# Patient Record
Sex: Male | Born: 1955
Health system: Southern US, Community
[De-identification: ages and names within clinical notes are randomized; demographics above are authoritative.]

## PROBLEM LIST (undated history)

## (undated) DIAGNOSIS — E785 Hyperlipidemia, unspecified: Secondary | ICD-10-CM

## (undated) DIAGNOSIS — E119 Type 2 diabetes mellitus without complications: Secondary | ICD-10-CM

---

## 1998-05-25 ENCOUNTER — Emergency Department (HOSPITAL_COMMUNITY): Admission: EM | Admit: 1998-05-25 | Discharge: 1998-05-25 | Payer: Self-pay | Admitting: Emergency Medicine

## 1998-07-09 ENCOUNTER — Encounter: Admission: RE | Admit: 1998-07-09 | Discharge: 1998-10-07 | Payer: Self-pay | Admitting: Neurosurgery

## 2007-07-31 ENCOUNTER — Ambulatory Visit: Payer: Self-pay | Admitting: Internal Medicine

## 2007-08-21 ENCOUNTER — Ambulatory Visit: Payer: Self-pay | Admitting: Internal Medicine

## 2007-08-21 ENCOUNTER — Inpatient Hospital Stay (HOSPITAL_COMMUNITY): Admission: AC | Admit: 2007-08-21 | Discharge: 2007-08-29 | Payer: Self-pay

## 2007-08-26 ENCOUNTER — Encounter (INDEPENDENT_AMBULATORY_CARE_PROVIDER_SITE_OTHER): Payer: Self-pay | Admitting: Internal Medicine

## 2007-08-30 ENCOUNTER — Ambulatory Visit: Payer: Self-pay | Admitting: *Deleted

## 2007-10-31 ENCOUNTER — Encounter (INDEPENDENT_AMBULATORY_CARE_PROVIDER_SITE_OTHER): Payer: Self-pay | Admitting: Internal Medicine

## 2007-10-31 ENCOUNTER — Ambulatory Visit: Payer: Self-pay | Admitting: Family Medicine

## 2007-10-31 LAB — CONVERTED CEMR LAB
ALT: <8 units/L (ref 0–53)
Albumin: 4.5 g/dL (ref 3.5–5.2)
Alkaline Phosphatase: 67 units/L (ref 39–117)
Basophils Absolute: 0.1 10*3/uL (ref 0.0–0.1)
Chloride: 107 meq/L (ref 96–112)
Creatinine, Ser: 1.14 mg/dL (ref 0.40–1.50)
HCT: 43.2 % (ref 39.0–52.0)
HCV Ab: NEGATIVE
Hep B Core Total Ab: NEGATIVE
Lymphocytes Relative: 34 % (ref 12–46)
Lymphs Abs: 1.6 10*3/uL (ref 0.7–4.0)
MCV: 87.8 fL (ref 78.0–100.0)
Monocytes Relative: 6 % (ref 3–12)
Neutrophils Relative %: 57 % (ref 43–77)
Potassium: 4.6 meq/L (ref 3.5–5.3)
RBC: 4.92 M/uL (ref 4.22–5.81)
RDW: 15.7 % — ABNORMAL HIGH (ref 11.5–15.5)
Sodium: 141 meq/L (ref 135–145)
Total Bilirubin: 0.5 mg/dL (ref 0.3–1.2)
WBC: 4.9 10*3/uL (ref 4.0–10.5)

## 2007-12-05 ENCOUNTER — Emergency Department (HOSPITAL_COMMUNITY): Admission: EM | Admit: 2007-12-05 | Discharge: 2007-12-05 | Payer: Self-pay | Admitting: Emergency Medicine

## 2008-03-05 ENCOUNTER — Encounter: Payer: Self-pay | Admitting: Family Medicine

## 2008-03-05 ENCOUNTER — Ambulatory Visit: Payer: Self-pay | Admitting: Internal Medicine

## 2008-03-05 LAB — CONVERTED CEMR LAB
Barbiturate Quant, Ur: NEGATIVE
Benzodiazepines.: NEGATIVE
Cholesterol: 152 mg/dL (ref 0–200)
Cocaine Metabolites: POSITIVE — AB
Marijuana Metabolite: NEGATIVE
Propoxyphene: NEGATIVE
Total CHOL/HDL Ratio: 2.4
Triglycerides: 75 mg/dL (ref <150)
VLDL: 15 mg/dL (ref 0–40)

## 2011-01-27 NOTE — Procedures (Signed)
EEG NUMBER:  [id number].   ORDERED BY:  Dr. Molli Knock and Dr. Pearlean Brownie.   This is a 55 year old person who was found unresponsive secondary to  cocaine, and was having seizures.  Off sedatives currently.  No seizures  witnessed clinically.   MEDICATIONS LISTED INCLUDE:  Protonix, heparin, aspirin, insulin,  NovoLog, flu vaccine, pneumonia vaccine, Cerebyx, albuterol, Versed,  fentanyl, Ativan, Lantus, Diprivan, and normal saline; but currently not  being sedated, apparently.   This is a portable 17 channel EEG with one channel devoted to EKG  utilizing [employer] 10/20 lead placement system.  The patient was  described as being unresponsive.  The patient had a prior study 2 days  before which showed almost a burst suppression pattern with a lot of  background slowing.  Currently, the study showed a very low amplitude  theta slowing fairly diffusely without any clear focal abnormality or  seizure activity.  This is fairly monotonous throughout with infrequent  higher voltage delta discharges noted, an artifact is seen as well.  Occasional poorly formed sharp-and-slow wave discharges are seen mostly  from the C3 regions, just to the left of midline, which are somewhat  epileptiform; but without clinical seizure activity noted.   Activation procedures were not performed.   The EKG monitor reveals a tachycardia with a rate of 102 beats per  minute.   CONCLUSION:  Abnormal EEG demonstrating severe generalized slowing with  occasional sharp-and-slow wave complexes seen around the C3 region.  This suggests the possible of a structural lesion in this region.  This  is somewhat different in appearance from the prior study, but equally  severe slowing.  Clinical correlation is recommended.      Catherine A. Orlin Hilding, M.D.  Electronically Signed     ZOX:WRUE  D:  08/24/2007 16:21:30  T:  08/25/2007 08:50:19  Job #:  [id number]

## 2011-01-27 NOTE — Discharge Summary (Signed)
NAMECURREN, MOHRMANN               ACCOUNT NO.:  [id number]   MEDICAL RECORD NO.:  [id number]          PATIENT TYPE:  INP   LOCATION:  5017                         FACILITY:  [facility]   PHYSICIAN:  Charlcie Cradle. Delford Field, MD, FCCPDATE OF BIRTH:  August 04, 1956   DATE OF ADMISSION:  08/21/2007  DATE OF DISCHARGE:  08/29/2007                               DISCHARGE SUMMARY   DISCHARGE DIAGNOSES:  1. Status post respiratory failure, secondary to altered mental      status.  2. Seizure disorder with history of cocaine and EtOH abuse.  3. Toxic metabolic encephalopathy.   PROCEDURES:  August 21, 2007 endotracheal tube.  Date August 22, 2007 right internal jugular vein catheter.  These all  since been removed at time of discharge.   LABORATORY DATA:  Date August 28, 2007:  Sodium 137, potassium 3.7,  chloride 101, CO2 27, glucose 132, BUN 17, creatinine 0.77, AST 364, ALT  73, albumin 2.3.  Date August 28, 2007:  White blood cell count 6.7, hemoglobin 10.7,  hematocrit 32.6, platelet count 278.   CONSULTATIONS:  Dr. Mcneil Sober.   RADIOLOGY:  MRI of brain, MRI/MRI of brain on August 22, 2007,  demonstrating no evidence of acute ischemia, right frontal  encephalomalacia with surrounding atrophy and gliosis, most likely  sequelae to previous trauma.  Focal MRA result demonstrates foci of  caliber irregularity, involving the anterior and posterior circulation.  Question as to whether represented a arteriosclerosis versus chemical  vasculitis, given history of cocaine.  There is old flow signal area in  the right MCA region inferiorly, which may represent prominent vessel  loop, with less likely possibility of aneurysm read by Dr. Corliss Skains.   BRIEF HISTORY:  This a 55 year old male patient who was found  unresponsive at a crack house.  He had a witnessed seizure by EMS en  route to the hospital, he had refractory seizures and status  epilepticus, requiring Diprivan drip and Ativan.  He  had a severe  metabolic acidosis on presentation.  He was admitted to the pulmonary  critical care service for further evaluation and treatment.  Neurology  consultation was obtained.   HOSPITAL COURSE BY DISCHARGE DIAGNOSES:  1. Acute respiratory failure secondary to altered mental status.  Mr.      Blenda Bridegroom was admitted to the intensive care setting.  He was placed      on full ventilatory support.  He was maintained on mechanical      ventilation, until his mental status improved.  He was successfully      extubated on August 26, 2007.  Since this time, his oxygen      support has been weaned.  He is now on room air without pulmonary      sequelae.  2. Altered mental status, seizure disorder.  As previously mentioned,      Mr. Theresa Mulligan was initially found unresponsive, then had status      epilepticus en route to the hospital.  He was treated aggressively,      initially placed on fosphenytoin, then Dilantin drip.  Because of  elevated LFTs, he was switched to Keppra by Dr. Pearlean Brownie.  He is now      all maintained on Keppra, his mental status a slight is slowly      improving to baseline.  He has had no further seizure activity.  He      will follow up with Dr. Pearlean Brownie in the outpatient setting.  It was      felt that seizure could been contributed to by cocaine-induced      vasculitis.  3. Toxic metabolic encephalopathy.  This is felt to be primarily      secondary to history of polysubstance abuse, as well as postictal      state, as well as severe metabolic acidosis at one point.  This has      all resolved.  Mr. Theresa Mulligan will be discharged under the watchful eye      of his family.   FOLLOWUP:  Dr. Mcneil Sober on January 29 at 12 noon.  He is also  pending [facility] followup, for which she will be assigned for primary  care.  Currently, our case manager is setting up this appointment.   DISCHARGE MEDICATIONS:  1. Keppra 500 mg tablets, 1 tablet b.i.d.  2. Thiamine 100 mg p.o.  daily.  3. Folic acid 1 mg p.o. daily.  4. Finally, aspirin 81 mg p.o. daily.   Upon time of discharge, he is stable.  Vital signs are stable.  Breath  sounds are clear to auscultation, neurologically intact and medically  ready for discharge.      Zenia Resides, NP      Charlcie Cradle. Delford Field, MD, [facility]  Electronically Signed    PB/MEDQ  D:  08/29/2007  T:  08/29/2007  Job:  [id number]

## 2011-01-27 NOTE — Procedures (Signed)
CLINICAL HISTORY:  A 55 year old male found unresponsive secondary to  cocaine use and possible seizures.   MEDICATIONS LISTED:  Ativan drip and Diprivan drip __________ at the  time of the EEG.   This is a portable EEG, with the patient described as unresponsive.  Performed using 17-channel machine and standard 10/20 electrode  placement.   The background is severely suppressed and consists of extremely low  amplitude (3-4 Hz delta activity) which lasted for 2-4 seconds and has  been interrupted with periods of 6-7 Hz __________, mixed with high  frequency low amplitude beta activity -- which is seen bilaterally in a  diffuse and symmetric distribution.  No clear paroxysmal epileptiform  activity spikes or sharp waves are seen.  No awake portions of the  record are noted.  Isolated intermittent sharp waves are noted rising  from left central and temporal head regions, but no clear epileptiform  features are noted.   LENGTH OF THE RECORDING:  22.4 minutes.   TECHNICAL COMPONENT:  average.   EKG TRACING:  Reveals regular sinus rhythm.   HYPERVENTILATION:  Is not performed.   PHOTIC STIMULATION:  Is unremarkable.   IMPRESSION:  This EEG is abnormal, due to presence of severe background  slowing and mild left-sided cortical irritability.  However, no definite  epileptiform features were noted.           ______________________________  Sunny Schlein. Pearlean Brownie, MD     DXI:PJAS  D:  08/22/2007 18:41:22  T:  08/23/2007 09:32:16  Job #:  [id number]

## 2011-01-27 NOTE — Consult Note (Signed)
[name], [name]               ACCOUNT NO.:  [id number]   MEDICAL RECORD NO.:  [id number]          PATIENT TYPE:  INP   LOCATION:  2115                         FACILITY:  [facility]   PHYSICIAN:  Pramod P. Pearlean Brownie, MD    DATE OF BIRTH:  November 26, 1955   DATE OF CONSULTATION:  DATE OF DISCHARGE:                                 CONSULTATION   REFERRING PHYSICIAN:  Felipa Evener, MD   REASON FOR REFERRAL:  seizures.   HISTORY OF PRESENT ILLNESS:  [name] is a 55 year old African  American male who was found unconscious and seizing in a crack house  early yesterday morning.  He was apparently having continuous seizures  requiring sedation and paralysis for intubation.  He has continued to  have seizures despite propofol drip and is at present on Ativan as well  as propofol drip.  He was found to be severely acidotic with a pH of 6.9  on admission.  He has been treated with fosphenytoin load as well as he  is on Ativan drip.  The patient is unable to provide any history, which  is obtained from his daughter who is present at the bedside.  The  patient apparently lives with a friend and does cocaine quite  frequently.  He was last seen normal the day before by the daughter.  His daughter does not remember any previous history of seizures or  significant neurological problems.  She does not remember any remote  head injury or stroke, and the CT scan on admission shows  encephalomalacia in the right frontal lobe.   PAST MEDICAL HISTORY:  Cocaine abuse.   SOCIAL HISTORY:  He lives with her friend.  He does cocaine frequently.   REVIEW OF SYSTEMS:  Not obtainable.   MEDICATIONS IN THE HOSPITAL:  Fosphenytoin, heparin, aspirin, Ventolin,  Ativan, propofol, fentanyl.   PHYSICAL EXAM:  An intubated middle-aged African American gentleman who  is unresponsive.  Temperature 98.2, pulse rate 90 per minute and regular, blood pressure  126/80, respiratory 20 per minute.  Distal pulses  well-felt.  HEAD:  Nontraumatic.  NECK:  Supple without bruits.  ENT:  Unremarkable.  CARDIAC:  Regular heart sounds.  No murmur or gallop.  LUNGS:  Clear to auscultation.  ABDOMEN:  Soft, nontender.  NEUROLOGIC:  The patient is comatose, unresponsive.  He is on Ativan  drip as well as propofol.  I examined him 5 minutes after the propofol  was switched off.  He was completely unresponsive.  Pupils are 2 mm,  sluggishly reactive.  Doll's eye movements are extremely sluggish.  He  does not wince to noxious stimuli.  He has no response to sternal rub.  He has minimum flexion response in upper extremities to nailbed  pressure.  There is minimum lower extremity flexor withdrawal.  Plantars  are not obtainable bilaterally.  Coordination and sensation were not  tested.   DATA REVIEWED:  Hospital chart was reviewed.  CT scan of the head  reveals no acute infarct and the old right frontal encephalomalacia,  which may represent remote-age infarct or hemorrhage.  EEG  and MRI are  both pending at this time.   IMPRESSION:  A 51-year gentleman with status epilepticus following  cocaine abuse.  His neurological exam is limited at this time secondary  to chemical, as well as postictal, state.   PLAN:  I would recommend aggressive treatment for seizures.  Will check  an EEG.  If he is having nonconvulsive seizures, we may need to be more  aggressive with the medications.  Otherwise, continue phenytoin for  seizure prophylaxis.  If he has more seizures, recommend adding IV  Depacon.  Check MRI to rule out any underlying stroke.  I had a long  discussion the patient's mother-in-law and daughter regarding his  prognosis and answered questions.  The patient's prognosis is quite  guarded at this point.  He may have __________ a hypoxic injury but it  is too early to comment on that, especially since his exam is limited  due to sedation.   I will be happy to follow the patient in consult.  Kindly call  for  questions.           ______________________________  Sunny Schlein. Pearlean Brownie, MD     PPS/MEDQ  D:  08/22/2007  T:  08/23/2007  Job:  [id number]

## 2011-01-27 NOTE — H&P (Signed)
NAMESHASHWAT, CLEARY NO.:  [id number]   MEDICAL RECORD NO.:  [id number]          PATIENT TYPE:  EMS   LOCATION:  MAJO                         FACILITY:  [facility]   PHYSICIAN:  Nelda Bucks, MD DATE OF BIRTH:  [birth year]   DATE OF ADMISSION:  08/21/2007  DATE OF DISCHARGE:                              HISTORY & PHYSICAL   Please note, this patient was identified as [id number] upon admission; now  we have identified him.   HISTORY OF PRESENT ILLNESS:  This is a 55 year old African American male  with a very limited history available at this point in time.  This  gentleman was found down at a crack house institution where illicit  drugs were taking place, and found unresponsive.  Also, at the crack  house, this patient was found to have active seizure activity with upper  and lower extremity shaking.  EMS was notified.  The patient was brought  to [facility], where the patient was also noted to  have seizures approximately 45 seconds to 1 minute duration with  multiple episodes.  The patient was taken to CT, and also had a seizure  there as well.  His GCF at that time was reported from the ER physician  to be 9.  The CAT scan did not reveal any intracranial hemorrhage.  He  was given Ativan aggressively for seizure treatment.  The patient  certainly required intubation for airway protection, as his pH was found  to be 6.9 with a very, very poor mental status.  The patient had  required etomidate and paralytic agents for intubation.  He also was  found to have a severe metabolic acidosis, and high minute ventilation  was required for his ventilator machine.  There is no other history  available at this time.   Past medical, past surgical, medications, allergies, social and family  history, and review of systems are completely unobtainable.   PHYSICAL EXAMINATION:  VITAL SIGNS:  Shows a temperature of 97.9, pulse  is 109, blood pressure is  134/75, respiratory rate is 20, pulse oximetry  is 100% on 30% FiO2 on the ventilator machine.  GENERAL:  Reveals the patient to be intubated, sedated and unresponsive  to painful stimuli.  HEENT:  Pupils are equally round and reactive to light, approximately 4  mm.  Please note that the right pupil is slightly smaller at 3 mm, and  they were all reactive but sluggish.  Corneal reflexes were intact.  Please note, this patient had recent paralytic agents given to him.  NECK:  Muscular and with no rigidity, with no significant jugular venous  distention.  CARDIOVASCULAR:  Showed an S1, S2.  Regular rate and rhythm.  No rubs,  no murmurs, no gallops, no heaves.  LUNGS:  Showed clear to auscultation bilaterally.  No rhonchi heard.  ABDOMINAL:  Showed a belly which was soft, nontender, nondistended.  No  rebound, no guarding.  Bowel sounds were hypoactive, but present.  EXTREMITIES:  Shows no edema and no rashes, and perfused warm  extremities.  NEUROLOGIC:  Very difficult to assess.  Pupils examination as stated  above.  He is unresponsive with recent sedation, and sedated to a RAS  scale of -4.  Again, no movement to painful stimuli.   LABORATORY DATA:  Showed a CK of 197.  His sodium was 141, potassium is  4.3, chloride is 104, bicarb is 7, with a BUN of 17, creatinine 2.09,  and a glucose of 227.  Calcium 9.7.  Anion gap is 30 calculated.  His  urinary tox screen shows cocaine and benzodiazepines.  Positive alcohol  of 5.  ABG initially showed pH of 6.9, pCO2 of 29, pO2 of 105, with a  calculated bicarb of 6.3.  Repeated on a ventilator is pH 7.21, pCO2 of  40, pO2 of 396, with a bicarb of 16 on current settings of tidal volume  is 600, rate of 30.  Myoglobulin is greater than 500 on point of care at  the bedside.  Troponin is less than 0.05.   A chest x-ray shows an endotracheal tube at approximately 6 cm above the  carina.  This will be advanced.  It also shows hyperinflated lung  films  consistent with a probable COPD.  Head CT, awaiting official report from  the radiologist, shows an old right frontal infarct with no intracranial  hemorrhage noted, and the ventricular system appears to be normal.   ASSESSMENT AND PLAN:  1. Seizure secondary to cocaine problem.  2. Rule out cocaine-induced stroke.  3. Metabolic acidosis uncompensated, rule out secondary to seizures      and lactic acidosis.  4. Hypoxia.  5. To rule out rhabdomyolysis secondary to cocaine overdose.  6. A large anion gap acidosis secondary to probable seizures, to rule      out additional ischemia, to rule out shock liver, or any other      abdominal source of ischemia.  7. Acute renal failure secondary to acute tubular necrosis versus      chronic renal insufficiency given the BUN and creatinine ratio,      which appears to be renal nature.  If the patient is a chronic      cocaine abuser, this could be secondary to __________  cocaine      ischemia to his kidneys.  8. Hyperglycemia.  9. To rule out anoxic brain injury, found down.   PLAN PER ASSISTANCE APPROACH:  1. Neuro:  The patient's head of bed already elevated.  CAT scan at      this point does not reveal any acute process and no intracranial      hemorrhage.  This does not mean he did not suffer an acute cocaine-      induced vasospasm ischemic stroke.  This patient will require an      MRI of his brain.  At this point in time, given the renal      insufficiency, we will not use gadolinium given the new FDA      warnings.  The patient has not been given dilantin, which is      appropriate given the fact that he his seizures are secondary to      cocaine.  We will be using benzodiazepines in the form of Versed 1-      2 mg q.1 hour p.r.n. or Ativan for any seizures noted.  The patient      will be given propofol for sedation.  Propofol is a good drug for      this patient because it also will reduce his  seizures at the       threshold, and since he is hemodynamically stable, his blood      pressure should be able to tolerate this.  The patient will be      given fentanyl for any pain noted.  We will also reassess an EEG to      rule out an active seizure focus.  2. Respiratory:  The patient required intubation given his poor GSC      and a severe uncompensated metabolic acidosis.  He is improving on      the ventilator with current settings tidal volume 600, rate of 30.      I would leave him on this high minute ventilation and repeat his      ABG at 11 a.m.  He also has an improved A-a gradient with a PA2      currently approaching 400, so his FiO2 has been reduced to 30%.      His endotracheal tube will also be advanced 2 cm onward and his      portable chest x-ray will be assessed tomorrow.  We will also give      this patient probable albuterol q.6 hourly MDIs given his probable      COPD noted on his portable chest x-ray.  3. Cardiovascular:  We are going to rule out cocaine-induced ischemia.      The patient is hemodynamically stable at this time.  We will also      assess an EKG, which has not been done at this time, as well as      serial troponin levels.  To note, if this patient does become      hypertensive, we will not be using isolated beta blockade for the      risk of an unopposed alpha on this patient.  If required, labetalol      could be a selection for him since it comes in alpha and beta      agonistic activity.  4. Renal:  The patient probably has a chronicity to some of his renal      failure, although hydration will be provided with this patient, and      he may require a renal ultrasound to look for medical renal      disease.  At this point, he is making very, very good urinal.  We      will follow his trend with a once hourly chemistry panel in the      p.m.  We will also be assessing a urine osmolality urine sodium.      To note, this patient also will require salicylate levels and  a      Tylenol level, and we will also assess his osmolality for an      osmolar gap.  His presentation thus far with improvement      significantly of his metabolic acidosis most likely is secondary to      lactic acidosis from seizure activity, which typically resolves      very quickly.  5. GI:  The patient will have LFTs checked to rule out shock liver and      cocaine-induced ischemia, which are pending.  We will also monitor      his stools very closely for loose stools or anything related to      mucosal sluffing related to potentially ischemia.  There is no      evidence of this at this time.  The patient did have persistent      metabolic acidosis.  Abdominal CT scan and/or flex sig would be      indicated at the time.  The patient will be given a proton pump      inhibitor IV, 40 mg of Protonix daily only.  Also, we will check an      amylase and lipase.  6. Infectious disease:  There is no evidence of aspiration on his      chest x-ray.  He is afebrile at this time.  If this patient spikes      greater than a 101.5, the patient will have blood cultures spent,      as well as a sputum.  We will start the patient on Unasyn since he      potentially comes from home.  7. Hematology:  The patient will have coagulation profile sent.  They      are pending at this time.  I have written for subcutaneous heparin      5000 t.i.d. for DVT prophylaxis given his high risk of being on the      ventilator machine.  If coagulation profile is abnormal, then the      subcu heparin will be discontinued.  At this time, there is no      active bleeding process noted on physical examination.  8. Endocrine:  The patient will have sliding scale insulin started for      his hyperglycemia.  We will try our best to obtain further history      from his daughter, who I think came to the emergency room and has      gone home.  I have not had the opportunity to discuss his status      with her.   This  patient is critically ill, requiring 45 minutes of my constant  attention and care with Dr. Norton Blizzard.      Nelda Bucks, MD  Electronically Signed     DJF/MEDQ  D:  08/21/2007  T:  08/21/2007  Job:  [id number]

## 2011-06-22 LAB — DIFFERENTIAL
Basophils Absolute: 0
Basophils Absolute: 0.1
Basophils Relative: 1
Eosinophils Absolute: 0.3
Eosinophils Relative: 0
Eosinophils Relative: 3
Lymphocytes Relative: 15
Lymphs Abs: 1.1
Monocytes Absolute: 0.1
Monocytes Relative: 2 — ABNORMAL LOW
Neutro Abs: 6
Neutrophils Relative %: 78 — ABNORMAL HIGH

## 2011-06-22 LAB — BASIC METABOLIC PANEL
BUN: 18
BUN: 18
CO2: 27
Calcium: 8.8
Chloride: 102
Chloride: 108
Creatinine, Ser: 0.84
Creatinine, Ser: 0.86
GFR calc Af Amer: >60
GFR calc Af Amer: >60
GFR calc non Af Amer: >60
GFR calc non Af Amer: >60
Glucose, Bld: 84
Potassium: 3.3 — ABNORMAL LOW
Potassium: 3.5
Sodium: 143

## 2011-06-22 LAB — CARDIAC PANEL(CRET KIN+CKTOT+MB+TROPI)
CK, MB: 1.7
Relative Index: 0.1

## 2011-06-22 LAB — POCT I-STAT 3, ART BLOOD GAS (G3+)
Acid-Base Excess: 2
Acid-Base Excess: 4 — ABNORMAL HIGH
Acid-base deficit: 26 — ABNORMAL HIGH
Acid-base deficit: 3 — ABNORMAL HIGH
Acid-base deficit: 3 — ABNORMAL HIGH
Bicarbonate: 16.4 — ABNORMAL LOW
Bicarbonate: 20.2
Bicarbonate: 28.3 — ABNORMAL HIGH
O2 Saturation: 100
O2 Saturation: 93
O2 Saturation: 96
Operator id: 129411
Operator id: 234041
Operator id: 236041
Operator id: 260421
Operator id: 299371
Patient temperature: 37
Patient temperature: 38.5
Patient temperature: 97.1
Patient temperature: 98.2
TCO2: 18
TCO2: 20
TCO2: 30
pCO2 arterial: 20.6 — ABNORMAL LOW
pCO2 arterial: 23.2 — ABNORMAL LOW
pCO2 arterial: 30.2 — ABNORMAL LOW
pCO2 arterial: 33.9 — ABNORMAL LOW
pCO2 arterial: 40.3
pH, Arterial: 7.426
pH, Arterial: 7.447
pH, Arterial: 7.453 — ABNORMAL HIGH
pH, Arterial: 7.574 — ABNORMAL HIGH
pO2, Arterial: 122 — ABNORMAL HIGH
pO2, Arterial: 396 — ABNORMAL HIGH
pO2, Arterial: 77 — ABNORMAL LOW

## 2011-06-22 LAB — LACTATE DEHYDROGENASE: LDH: 221

## 2011-06-22 LAB — CBC
HCT: 32.6 — ABNORMAL LOW
HCT: 32.8 — ABNORMAL LOW
HCT: 33.2 — ABNORMAL LOW
HCT: 34.6 — ABNORMAL LOW
HCT: 35.2 — ABNORMAL LOW
Hemoglobin: 10.7 — ABNORMAL LOW
Hemoglobin: 10.8 — ABNORMAL LOW
Hemoglobin: 11 — ABNORMAL LOW
Hemoglobin: 11.2 — ABNORMAL LOW
MCHC: 32.5
MCHC: 32.9
MCHC: 33
MCHC: 33.2
MCHC: 33.2
MCV: 84
MCV: 85
MCV: 85.2
MCV: 85.3
Platelets: 195
Platelets: 198
Platelets: 204
Platelets: 236
Platelets: 261
Platelets: 278
RBC: 3.82 — ABNORMAL LOW
RBC: 3.86 — ABNORMAL LOW
RBC: 3.97 — ABNORMAL LOW
RBC: 4.06 — ABNORMAL LOW
RBC: 4.18 — ABNORMAL LOW
RDW: 14.4
RDW: 14.8
RDW: 14.8
RDW: 14.8
RDW: 14.9
WBC: 11.1 — ABNORMAL HIGH
WBC: 15.4 — ABNORMAL HIGH
WBC: 6.7
WBC: 7.6
WBC: 8

## 2011-06-22 LAB — COMPREHENSIVE METABOLIC PANEL
ALT: 10
ALT: 11
ALT: 16
ALT: 29
ALT: 46
ALT: 73 — ABNORMAL HIGH
ALT: 81 — ABNORMAL HIGH
ALT: 9
AST: 188 — ABNORMAL HIGH
AST: 29
AST: 364 — ABNORMAL HIGH
AST: 671 — ABNORMAL HIGH
Albumin: 2.3 — ABNORMAL LOW
Albumin: 2.5 — ABNORMAL LOW
Albumin: 2.6 — ABNORMAL LOW
Albumin: 2.7 — ABNORMAL LOW
Albumin: 3 — ABNORMAL LOW
Albumin: 3.4 — ABNORMAL LOW
Alkaline Phosphatase: 103
Alkaline Phosphatase: 52
Alkaline Phosphatase: 53
Alkaline Phosphatase: 58
Alkaline Phosphatase: 62
Alkaline Phosphatase: 63
Alkaline Phosphatase: 66
BUN: 15
BUN: 17
BUN: 17
BUN: 18
BUN: 20
BUN: 20
BUN: 22
CO2: 19
CO2: 21
CO2: 22
CO2: 24
CO2: 27
CO2: 27
CO2: 7 — CL
Calcium: 8.2 — ABNORMAL LOW
Calcium: 8.2 — ABNORMAL LOW
Calcium: 8.3 — ABNORMAL LOW
Calcium: 8.4
Calcium: 8.8
Calcium: 8.9
Chloride: 101
Chloride: 104
Chloride: 109
Chloride: 109
Chloride: 109
Chloride: 111
Creatinine, Ser: 0.77
Creatinine, Ser: 0.95
Creatinine, Ser: 1.1
Creatinine, Ser: 1.3
Creatinine, Ser: 1.39
GFR calc Af Amer: >60
GFR calc Af Amer: >60
GFR calc Af Amer: >60
GFR calc Af Amer: >60
GFR calc non Af Amer: 50 — ABNORMAL LOW
GFR calc non Af Amer: 54 — ABNORMAL LOW
GFR calc non Af Amer: 58 — ABNORMAL LOW
GFR calc non Af Amer: >60
GFR calc non Af Amer: >60
GFR calc non Af Amer: >60
Glucose, Bld: 111 — ABNORMAL HIGH
Glucose, Bld: 125 — ABNORMAL HIGH
Glucose, Bld: 132 — ABNORMAL HIGH
Glucose, Bld: 145 — ABNORMAL HIGH
Glucose, Bld: 227 — ABNORMAL HIGH
Glucose, Bld: 68 — ABNORMAL LOW
Potassium: 3.6
Potassium: 3.6
Potassium: 3.6
Potassium: 3.7
Potassium: 3.7
Potassium: 4.1
Potassium: 4.3
Sodium: 134 — ABNORMAL LOW
Sodium: 137
Sodium: 137
Sodium: 139
Sodium: 140
Sodium: 140
Sodium: 144
Total Bilirubin: 0.4
Total Bilirubin: 0.5
Total Bilirubin: 0.9
Total Bilirubin: 0.9
Total Bilirubin: 1
Total Bilirubin: 1.2
Total Protein: 5.6 — ABNORMAL LOW
Total Protein: 6.1
Total Protein: 6.1
Total Protein: 6.3
Total Protein: 6.4
Total Protein: 6.5

## 2011-06-22 LAB — CULTURE, BLOOD (ROUTINE X 2)

## 2011-06-22 LAB — PHENYTOIN LEVEL, TOTAL: Phenytoin Lvl: 23.2 — ABNORMAL HIGH

## 2011-06-22 LAB — POCT CARDIAC MARKERS: Myoglobin, poc: >500

## 2011-06-22 LAB — MAGNESIUM
Magnesium: 2.2
Magnesium: 2.4

## 2011-06-22 LAB — URINE CULTURE

## 2011-06-22 LAB — TROPONIN I: Troponin I: 0.03

## 2011-06-22 LAB — LACTIC ACID, PLASMA: Lactic Acid, Venous: 2.6 — ABNORMAL HIGH

## 2011-06-22 LAB — RAPID URINE DRUG SCREEN, HOSP PERFORMED
Amphetamines: NOT DETECTED
Barbiturates: NOT DETECTED
Benzodiazepines: POSITIVE — AB
Cocaine: POSITIVE — AB
Opiates: NOT DETECTED
Tetrahydrocannabinol: NOT DETECTED

## 2011-06-22 LAB — OSMOLALITY, URINE: Osmolality, Ur: 380 — ABNORMAL LOW

## 2011-06-22 LAB — SODIUM, URINE, RANDOM: Sodium, Ur: 99

## 2011-06-22 LAB — PHOSPHORUS
Phosphorus: 1.8 — ABNORMAL LOW
Phosphorus: 2.7

## 2011-06-22 LAB — CK TOTAL AND CKMB (NOT AT ARMC)
CK, MB: 2.8
Relative Index: 0.4
Total CK: 674 — ABNORMAL HIGH

## 2011-06-22 LAB — APTT: aPTT: 23 — ABNORMAL LOW

## 2011-06-22 LAB — TRIGLYCERIDES: Triglycerides: 67

## 2011-06-22 LAB — ETHANOL: Alcohol, Ethyl (B): 5

## 2011-06-22 LAB — LIPASE, BLOOD: Lipase: 17

## 2018-06-01 ENCOUNTER — Ambulatory Visit (INDEPENDENT_AMBULATORY_CARE_PROVIDER_SITE_OTHER): Payer: Self-pay | Admitting: Family Medicine

## 2018-06-01 ENCOUNTER — Other Ambulatory Visit: Payer: Self-pay

## 2018-06-01 VITALS — BP 140/80 | HR 65 | Temp 97.5°F | Wt 200.4 lb

## 2018-06-01 DIAGNOSIS — O24119 Pre-existing diabetes mellitus, type 2, in pregnancy, unspecified trimester: Secondary | ICD-10-CM

## 2018-06-01 DIAGNOSIS — K5900 Constipation, unspecified: Secondary | ICD-10-CM

## 2018-06-01 DIAGNOSIS — E785 Hyperlipidemia, unspecified: Secondary | ICD-10-CM

## 2018-06-01 DIAGNOSIS — H9193 Unspecified hearing loss, bilateral: Secondary | ICD-10-CM

## 2018-06-01 DIAGNOSIS — E119 Type 2 diabetes mellitus without complications: Secondary | ICD-10-CM

## 2018-06-01 MED ORDER — LACTULOSE 10 GM/15ML PO SOLN: 10.0000 g | Freq: Every day | ORAL | 2 refills | Status: DC | PRN

## 2018-06-01 MED ORDER — METFORMIN HCL 500 MG PO TABS: 500.0000 mg | ORAL_TABLET | Freq: Two times a day (BID) | ORAL | 3 refills | Status: DC

## 2018-06-01 MED ORDER — ATORVASTATIN CALCIUM 20 MG PO TABS: 20.0000 mg | ORAL_TABLET | Freq: Every day | ORAL | 6 refills | Status: DC

## 2018-06-01 MED ORDER — DOCUSATE SODIUM 100 MG PO CAPS: 100.0000 mg | ORAL_CAPSULE | Freq: Every day | ORAL | 0 refills | Status: DC

## 2018-06-01 MED ORDER — INSULIN ISOPHANE HUMAN 100 UNIT/ML KWIKPEN: 8.0000 [IU] | PEN_INJECTOR | Freq: Every day | SUBCUTANEOUS | 11 refills | Status: DC

## 2018-06-01 MED ORDER — NICOTINE 14 MG/24HR TD PT24: 14.0000 mg | MEDICATED_PATCH | Freq: Every day | TRANSDERMAL | 0 refills | Status: DC

## 2018-06-01 MED ORDER — ATORVASTATIN CALCIUM 40 MG PO TABS: 40.0000 mg | ORAL_TABLET | Freq: Every day | ORAL | 3 refills | Status: DC
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
## 2018-06-01 NOTE — Patient Instructions (Signed)
It was great to meet you today! Thank you for letting me participate in your care!  Today, we discussed your overall health care. I have refilled your medications for your chronic medical conditions. Please don't hesitate to call me if you are having health issues or concerns. I will see you for follow up in 3 months.  Be well, Harolyn Rutherford, DO PGY-2, [facility]

## 2018-06-02 LAB — CBC WITH DIFFERENTIAL/PLATELET
Basophils Absolute: 0 x10E3/uL (ref 0.0–0.2)
Basos: 1 %
EOS (ABSOLUTE): 0.2 x10E3/uL (ref 0.0–0.4)
Eos: 3 %
Hematocrit: 44.5 % (ref 37.5–51.0)
Hemoglobin: 14.7 g/dL (ref 13.0–17.7)
Immature Grans (Abs): 0 x10E3/uL (ref 0.0–0.1)
Immature Granulocytes: 0 %
Lymphocytes Absolute: 2.9 x10E3/uL (ref 0.7–3.1)
Lymphs: 47 %
MCH: 27.7 pg (ref 26.6–33.0)
MCHC: 33 g/dL (ref 31.5–35.7)
MCV: 84 fL (ref 79–97)
Monocytes Absolute: 0.4 x10E3/uL (ref 0.1–0.9)
Monocytes: 7 %
Neutrophils Absolute: 2.6 x10E3/uL (ref 1.4–7.0)
Neutrophils: 42 %
Platelets: 219 x10E3/uL (ref 150–450)
RBC: 5.31 x10E6/uL (ref 4.14–5.80)
RDW: 15.1 % (ref 12.3–15.4)
WBC: 6.2 x10E3/uL (ref 3.4–10.8)

## 2018-06-02 LAB — LIPID PANEL
Chol/HDL Ratio: 2.4 ratio (ref 0.0–5.0)
Cholesterol, Total: 148 mg/dL (ref 100–199)
HDL: 62 mg/dL
LDL Calculated: 68 mg/dL (ref 0–99)
Triglycerides: 89 mg/dL (ref 0–149)
VLDL Cholesterol Cal: 18 mg/dL (ref 5–40)

## 2018-06-02 LAB — BASIC METABOLIC PANEL
BUN / CREAT RATIO: 22 (ref 10–24)
BUN: 23 mg/dL (ref 8–27)
CO2: 25 mmol/L (ref 20–29)
CREATININE: 1.06 mg/dL (ref 0.76–1.27)
Calcium: 10.6 mg/dL — ABNORMAL HIGH (ref 8.6–10.2)
Chloride: 102 mmol/L (ref 96–106)
GFR, EST AFRICAN AMERICAN: 86 mL/min/{1.73_m2} (ref >59)
GFR, EST NON AFRICAN AMERICAN: 74 mL/min/{1.73_m2} (ref >59)
Glucose: 103 mg/dL — ABNORMAL HIGH (ref 65–99)
Potassium: 4.4 mmol/L (ref 3.5–5.2)
Sodium: 140 mmol/L (ref 134–144)

## 2018-06-03 ENCOUNTER — Encounter: Payer: Self-pay | Admitting: Family Medicine

## 2018-06-03 NOTE — Progress Notes (Signed)
Called patient and attempted to leave message about her lab results. Her voice mail box was full was unable to leave a voice mail.   I am sending her a letter informing her of her essentially normal test results.

## 2018-06-07 DIAGNOSIS — E119 Type 2 diabetes mellitus without complications: Secondary | ICD-10-CM | POA: Insufficient documentation

## 2018-06-07 DIAGNOSIS — E785 Hyperlipidemia, unspecified: Secondary | ICD-10-CM | POA: Insufficient documentation

## 2018-06-07 DIAGNOSIS — K5900 Constipation, unspecified: Secondary | ICD-10-CM | POA: Insufficient documentation

## 2018-06-07 NOTE — Assessment & Plan Note (Signed)
A1c at next visit. Patient also encouraged to check his blood glucose at home before taking insulin and in the morning before breakfast. Patient has a brother with whom he lives that will help him monitor his blood glucose.   Continue Humalin 8U QHS and Metformin 500mg  BID until A1c

## 2018-06-07 NOTE — Assessment & Plan Note (Signed)
Continue Atorvastatin 20mg , lipid panel today

## 2018-06-07 NOTE — Assessment & Plan Note (Signed)
Continue docusate once a day and lactulose 3ml prn for constipation

## 2018-06-24 ENCOUNTER — Telehealth: Payer: Self-pay | Admitting: Family Medicine

## 2018-06-24 NOTE — Telephone Encounter (Signed)
Patient needs test strips for insulin to [employer].

## 2018-06-24 NOTE — Telephone Encounter (Signed)
Patient wants test strips which I do not see listed on meds list.  .Ozella Almond, CMA

## 2018-06-28 ENCOUNTER — Other Ambulatory Visit: Payer: Self-pay | Admitting: Family Medicine

## 2018-06-28 MED ORDER — GLUCOSE BLOOD VI STRP: ORAL_STRIP | 12 refills | Status: DC

## 2018-06-28 MED ORDER — ATORVASTATIN CALCIUM 20 MG PO TABS: 20.0000 mg | ORAL_TABLET | Freq: Every day | ORAL | 6 refills | Status: DC

## 2018-06-28 NOTE — Telephone Encounter (Signed)
Patient needs Atorvastatin refilled, and still needs test strips.

## 2018-06-28 NOTE — Progress Notes (Unsigned)
Sending in prescription for test strips

## 2018-06-29 NOTE — Telephone Encounter (Signed)
lmovm for pt to return call. Ordean Fouts Dawn, CMA  

## 2018-08-31 ENCOUNTER — Ambulatory Visit: Payer: Medicaid Other | Admitting: Family Medicine

## 2018-08-31 VITALS — BP 121/60 | HR 61 | Temp 97.6°F | Wt 212.2 lb

## 2018-08-31 DIAGNOSIS — E119 Type 2 diabetes mellitus without complications: Secondary | ICD-10-CM | POA: Diagnosis not present

## 2018-08-31 DIAGNOSIS — R05 Cough: Secondary | ICD-10-CM | POA: Diagnosis not present

## 2018-08-31 DIAGNOSIS — K5900 Constipation, unspecified: Secondary | ICD-10-CM

## 2018-08-31 DIAGNOSIS — R059 Cough, unspecified: Secondary | ICD-10-CM

## 2018-08-31 DIAGNOSIS — M1990 Unspecified osteoarthritis, unspecified site: Secondary | ICD-10-CM

## 2018-08-31 HISTORY — DX: Cough, unspecified: R05.9

## 2018-08-31 HISTORY — DX: Unspecified osteoarthritis, unspecified site: M19.90

## 2018-08-31 LAB — POCT GLYCOSYLATED HEMOGLOBIN (HGB A1C): HbA1c, POC (controlled diabetic range): 6.3 % (ref 0.0–7.0)

## 2018-08-31 MED ORDER — MELOXICAM 15 MG PO TABS: 15.0000 mg | ORAL_TABLET | Freq: Every day | ORAL | 0 refills | Status: DC

## 2018-08-31 MED ORDER — GUAIFENESIN-DM 100-10 MG/5ML PO SYRP: 5.0000 mL | ORAL_SOLUTION | ORAL | 0 refills | Status: DC | PRN

## 2018-08-31 MED ORDER — METFORMIN HCL 500 MG PO TABS: 500.0000 mg | ORAL_TABLET | Freq: Two times a day (BID) | ORAL | 0 refills | Status: DC

## 2018-08-31 MED ORDER — ATORVASTATIN CALCIUM 20 MG PO TABS: 20.0000 mg | ORAL_TABLET | Freq: Every day | ORAL | 0 refills | Status: DC

## 2018-08-31 NOTE — Progress Notes (Signed)
   HPI 62 year old who presents for diabetes follow-up and medication refill.  Previously seen by Dr. Garlan Fillers back in September 2019.  No A1c from that visit.  Has been taking Humulin 8 units daily in the morning, metformin 1 g at night.  Patient states he has been eating better and has been walking quite a bit.  Patient was prescribed lactulose while he was in prison for constipation.  He states this is been helping quite a bit and does not want to switch to anything else.  Patient with right elbow pain which she says has known arthritis.  He feels is consistent with arthritis flare.  He also is been having a persistent cough.  Started about a week ago and he is a nursing home symmetries cough.  No productive sputum no blood.  He states he recently had a small cold.  All symptoms have resolved aside from a cough.  CC: Diabetes management   ROS:   Review of Systems See HPI for ROS.   CC, SH/smoking status, and VS noted  Objective: BP 121/60   Pulse 61   Temp 97.6 F (36.4 C) (Oral)   Wt 212 lb 3.2 oz (96.3 kg)   SpO2 100%  Gen: Very pleasant 62 year old African-American male, resting comfortably on exam bed CV: RRR, no murmur Resp: CTAB, no wheezes, non-labored Abd: SNTND, BS present, no guarding or organomegaly Neuro: Alert and oriented, Speech clear, No gross deficits   Assessment and plan:  Type 2 diabetes mellitus without complication (HCC) O2H 6.3.  He will and no longer necessary.  We will stop this medication and have him just take metformin 1 g at night.  Follow-up in 3 months for A1c check.  Encouraged to continue his diet and exercise.  Arthritis Will give trial of meloxicam 15 mg daily for 3 weeks.  If does not work, encourage patient to follow-up with PCP for further management.  Cough Gave prescription for over-the-counter Robitussin-DM to hopefully alleviate some cost.  Likely post viral cough as patient stated he recently had a small  cold.  Constipation Patient taking lactulose 15 mL as needed for constipation.  Could consider switching to MiraLAX but patient does not feel like switching to this area.  Follow-up as needed   Orders Placed This Encounter  Procedures  . HgB A1c    Meds ordered this encounter  Medications  . meloxicam (MOBIC) 15 MG tablet    Sig: Take 1 tablet (15 mg total) by mouth daily.    Dispense:  21 tablet    Refill:  0  . guaiFENesin-dextromethorphan (ROBITUSSIN DM) 100-10 MG/5ML syrup    Sig: Take 5 mLs by mouth every 4 (four) hours as needed for cough.    Dispense:  118 mL    Refill:  0  . atorvastatin (LIPITOR) 20 MG tablet    Sig: Take 1 tablet (20 mg total) by mouth daily.    Dispense:  180 tablet    Refill:  0  . metFORMIN (GLUCOPHAGE) 500 MG tablet    Sig: Take 1 tablet (500 mg total) by mouth 2 (two) times daily with a meal.    Dispense:  180 tablet    Refill:  0   Guadalupe Dawn MD PGY-2 Family Medicine Resident  08/31/2018 9:58 AM

## 2018-08-31 NOTE — Assessment & Plan Note (Signed)
Patient taking lactulose 15 mL as needed for constipation.  Could consider switching to MiraLAX but patient does not feel like switching to this area.  Follow-up as needed

## 2018-08-31 NOTE — Patient Instructions (Addendum)
It was great meeting you today! Your A1C was 6.3. This means that you no longer need to take any insulin. Continue taking the metformin 1g at night. Please follow up in 3 months for another check, and your regimen can be titrated at that visit.  I refilled your metformin and atorvastatin. I also gave you an anti-inflammatory to help with your arthritis and your abdominal wall pain.  I gave you a script for robitussin DM to hopefully make it a little cheaper.

## 2018-08-31 NOTE — Assessment & Plan Note (Addendum)
Gave prescription for over-the-counter Robitussin-DM to hopefully alleviate some cost.  Likely post viral cough as patient stated he recently had a small cold.

## 2018-08-31 NOTE — Assessment & Plan Note (Signed)
Will give trial of meloxicam 15 mg daily for 3 weeks.  If does not work, encourage patient to follow-up with PCP for further management.

## 2018-08-31 NOTE — Assessment & Plan Note (Signed)
A1c 6.3.  He will and no longer necessary.  We will stop this medication and have him just take metformin 1 g at night.  Follow-up in 3 months for A1c check.  Encouraged to continue his diet and exercise.

## 2018-09-17 ENCOUNTER — Encounter (HOSPITAL_COMMUNITY): Payer: Self-pay

## 2018-09-17 ENCOUNTER — Emergency Department (HOSPITAL_COMMUNITY): Payer: Medicaid Other

## 2018-09-17 ENCOUNTER — Other Ambulatory Visit: Payer: Self-pay

## 2018-09-17 ENCOUNTER — Emergency Department (HOSPITAL_COMMUNITY)
Admission: EM | Admit: 2018-09-17 | Discharge: 2018-09-17 | Disposition: A | Discharge status: Home / Self Care | Payer: Medicaid Other | Attending: Emergency Medicine | Admitting: Emergency Medicine

## 2018-09-17 DIAGNOSIS — F17210 Nicotine dependence, cigarettes, uncomplicated: Secondary | ICD-10-CM | POA: Diagnosis not present

## 2018-09-17 DIAGNOSIS — S161XXA Strain of muscle, fascia and tendon at neck level, initial encounter: Secondary | ICD-10-CM | POA: Insufficient documentation

## 2018-09-17 DIAGNOSIS — E119 Type 2 diabetes mellitus without complications: Secondary | ICD-10-CM | POA: Insufficient documentation

## 2018-09-17 DIAGNOSIS — S39012A Strain of muscle, fascia and tendon of lower back, initial encounter: Secondary | ICD-10-CM | POA: Diagnosis not present

## 2018-09-17 DIAGNOSIS — Y9389 Activity, other specified: Secondary | ICD-10-CM | POA: Insufficient documentation

## 2018-09-17 DIAGNOSIS — Y929 Unspecified place or not applicable: Secondary | ICD-10-CM | POA: Insufficient documentation

## 2018-09-17 DIAGNOSIS — Z79899 Other long term (current) drug therapy: Secondary | ICD-10-CM | POA: Diagnosis not present

## 2018-09-17 DIAGNOSIS — Y999 Unspecified external cause status: Secondary | ICD-10-CM | POA: Insufficient documentation

## 2018-09-17 MED ORDER — IBUPROFEN 800 MG PO TABS
800.0000 mg | ORAL_TABLET | Freq: Once | ORAL | Status: AC
  Administered 2018-09-17: 800 mg via ORAL
  Filled 2018-09-17: qty 1

## 2018-09-17 MED ORDER — IBUPROFEN 600 MG PO TABS: 600.0000 mg | ORAL_TABLET | Freq: Three times a day (TID) | ORAL | 0 refills | Status: DC | PRN

## 2018-09-17 MED ORDER — TIZANIDINE HCL 4 MG PO TABS: 4.0000 mg | ORAL_TABLET | Freq: Three times a day (TID) | ORAL | 0 refills | Status: DC | PRN
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
## 2018-10-31 ENCOUNTER — Other Ambulatory Visit: Payer: Self-pay | Admitting: Family Medicine

## 2018-10-31 NOTE — Telephone Encounter (Signed)
Called patient to find out which medications he needed refilled based on the message he left this morning.Patient stated that he already has  his medications from the pharmacy.  [name], [employer]

## 2018-10-31 NOTE — Telephone Encounter (Signed)
Patient is out of all of his medications, says it is just two.  It is supposed to go to [facility] on 29.  Please call with any questions [phone]

## 2018-11-15 ENCOUNTER — Other Ambulatory Visit: Payer: Self-pay

## 2018-11-15 ENCOUNTER — Encounter: Payer: Self-pay | Admitting: Family Medicine

## 2018-11-15 ENCOUNTER — Ambulatory Visit: Payer: Medicaid Other | Admitting: Family Medicine

## 2018-11-15 VITALS — BP 124/80 | HR 60 | Temp 98.0°F | Ht 75.0 in | Wt 193.6 lb

## 2018-11-15 DIAGNOSIS — E119 Type 2 diabetes mellitus without complications: Secondary | ICD-10-CM | POA: Diagnosis not present

## 2018-11-15 LAB — POCT GLYCOSYLATED HEMOGLOBIN (HGB A1C): HbA1c, POC (controlled diabetic range): 6.5 % (ref 0.0–7.0)

## 2018-11-15 MED ORDER — NICOTINE 14 MG/24HR TD PT24: 14.0000 mg | MEDICATED_PATCH | Freq: Every day | TRANSDERMAL | 0 refills | Status: DC

## 2018-11-15 NOTE — Progress Notes (Signed)
     Subjective: Chief Complaint  Patient presents with  . Follow-up    HPI: [name] is a 63 y.o. presenting to clinic today to discuss the following:  T2DM Patient returns to clinic for routine check up for his DM. He has been off insulin and reports no symptoms of dysuria, urinary frequency, polydipsia, or increased hunger. He continues to take metformin daily at night with no side effects. He states he is doing well overall and has no complaints. He has been drinking sugary drinks like sodas and does admit to eating a "good amount" of sweets lately.  ROS noted in HPI.   Past Medical, Surgical, Social, and Family History Reviewed & Updated per EMR.   Pertinent Historical Findings include:   Social History   Tobacco Use  Smoking Status Current Every Day Smoker  . Packs/day: 0.50  . Years: 25.00  . Pack years: 12.50  . Types: Cigarettes  Smokeless Tobacco Current User    Objective: BP 124/80   Pulse 60   Temp 98 F (36.7 C) (Oral)   Ht 6\' 3"  (1.905 m)   Wt 193 lb 9.6 oz (87.8 kg)   SpO2 97%   BMI 24.20 kg/m  Vitals and nursing notes reviewed  Physical Exam Gen: Alert and Oriented x 3, NAD HEENT: Normocephalic, atraumatic, PERRLA, EOMI Neck: supple, no LAD CV: RRR, no murmurs, normal S1, S2 split Resp: CTAB, no wheezing, rales, or rhonchi, comfortable work of breathing Ext: no clubbing, cyanosis, or edema Neuro: No gross deficits Skin: warm, dry, intact, no rashes  Results for orders placed or performed in visit on 11/15/18 (from the past 72 hour(s))  HgB A1c     Status: None   Collection Time: 11/15/18  1:47 PM  Result Value Ref Range   Hemoglobin A1C     HbA1c POC (<> result, manual entry)     HbA1c, POC (prediabetic range)     HbA1c, POC (controlled diabetic range) 6.5 0.0 - 7.0 %    Assessment/Plan:  Type 2 diabetes mellitus without complication (HCC) Patient insulin regimen was d/c'd from last visit 3 months ago. A1c today borderline but  still below goal just on Metformin. Advised to avoid sugary drinks like sodas and sweet tea, avoid candy, snacks, and food high in sugar like cakes, donuts, etc. Patient agreed - Follow up A1c in 3 months - Cont Metformin 1000mg  qhs   PATIENT EDUCATION PROVIDED: See AVS    Diagnosis and plan along with any newly prescribed medication(s) were discussed in detail with this patient today. The patient verbalized understanding and agreed with the plan. Patient advised if symptoms worsen return to clinic or ER.    Orders Placed This Encounter  Procedures  . HgB A1c    Meds ordered this encounter  Medications  . nicotine (NICODERM CQ - DOSED IN MG/24 HOURS) 14 mg/24hr patch    Sig: Place 1 patch (14 mg total) onto the skin daily.    Dispense:  28 patch    Refill:  0     Harolyn Rutherford, DO 11/15/2018, 2:02 PM PGY-2 [place]

## 2018-11-15 NOTE — Patient Instructions (Signed)
It was great to see you today! Thank you for letting me participate in your care!  Today, we discussed your recent blood sugar levels. I want you to STOP drinking all regular sodas and drink NON-sweet drinks. Water is best but diet sodas are allowed. Please also cut back on foods high in sugar like sweets, snacks, etc.  I have sent in nicotine patches. Use one on your arm per day.  Be well, Harolyn Rutherford, DO PGY-2, [facility]

## 2018-11-17 ENCOUNTER — Telehealth: Payer: Self-pay | Admitting: Family Medicine

## 2018-11-17 NOTE — Telephone Encounter (Signed)
Pt came in office stated that he couldn't afford the nicotine patch that was prescribed to him. He is asking for alternative patch. Please call patient [phone]. Thank you.

## 2018-11-20 NOTE — Assessment & Plan Note (Signed)
Patient insulin regimen was d/c'd from last visit 3 months ago. A1c today borderline but still below goal just on Metformin. Advised to avoid sugary drinks like sodas and sweet tea, avoid candy, snacks, and food high in sugar like cakes, donuts, etc. Patient agreed - Follow up A1c in 3 months - Cont Metformin 1000mg  qhs

## 2018-12-07 NOTE — Telephone Encounter (Signed)
Patient needs machine to check blood sugar.  Need prescription for this send to [facility] off of [address]  [phone]  Patient wouldn't listen to our new policy about being in office.

## 2018-12-15 ENCOUNTER — Other Ambulatory Visit: Payer: Self-pay | Admitting: Family Medicine

## 2018-12-15 MED ORDER — BLOOD GLUCOSE-BP MONITOR DEVI: 1.0000 "application " | 0 refills | Status: AC | PRN

## 2018-12-15 NOTE — Progress Notes (Unsigned)
Sending in prescription for glucose monitor

## 2019-02-17 ENCOUNTER — Ambulatory Visit (INDEPENDENT_AMBULATORY_CARE_PROVIDER_SITE_OTHER): Payer: Medicaid Other | Admitting: Family Medicine

## 2019-02-17 ENCOUNTER — Other Ambulatory Visit: Payer: Self-pay

## 2019-02-17 VITALS — BP 120/80 | HR 58

## 2019-02-17 DIAGNOSIS — H9193 Unspecified hearing loss, bilateral: Secondary | ICD-10-CM

## 2019-02-17 DIAGNOSIS — G8929 Other chronic pain: Secondary | ICD-10-CM

## 2019-02-17 DIAGNOSIS — E119 Type 2 diabetes mellitus without complications: Secondary | ICD-10-CM | POA: Diagnosis not present

## 2019-02-17 DIAGNOSIS — M25511 Pain in right shoulder: Secondary | ICD-10-CM

## 2019-02-17 LAB — POCT GLYCOSYLATED HEMOGLOBIN (HGB A1C): HbA1c, POC (controlled diabetic range): 6.5 % (ref 0.0–7.0)

## 2019-02-17 MED ORDER — MELOXICAM 15 MG PO TABS: 15.0000 mg | ORAL_TABLET | Freq: Every day | ORAL | 0 refills | Status: DC

## 2019-02-17 NOTE — Patient Instructions (Signed)
It was great to see you today! Thank you for letting me participate in your care!  Today, we discussed right shoulder pain which is most likely an acute flare of your chronic arthritis in your shoulder. I have given you a medication called Meloxicam. Please take it WITH FOOD once a day for two weeks. If it is not better then please return to the clinic.  Be well, Shane Rutherford, DO PGY-2, [facility]

## 2019-02-17 NOTE — Progress Notes (Signed)
     Subjective: Chief Complaint  Patient presents with  . Shoulder Pain    HPI: [name] is a 63 y.o. presenting to clinic today to discuss the following:  Right Shoulder Pain Patient states lately he has been experiencing non-radiating right shoulder pain. He endorses a history of osteoarthritis in that shoulder. He has had no falls, injuries, or accidents. He does not remember feeling any "pop" like sensation in his shoulder or acute pain recently. He states sometimes "it just starts hurting". It is worse with rotational movement, better with rest, hurts to sleep on it at night. He states it mostly bothers him at night when he attempts to sleep on it.    Denies fever, chills, cough, SOB, CP, abdominal pain, nausea, vomiting, diarrhea, or constipation.  ROS noted in HPI.   Past Medical, Surgical, Social, and Family History Reviewed & Updated per EMR.   Pertinent Historical Findings include:   Social History   Tobacco Use  Smoking Status Current Every Day Smoker  . Packs/day: 0.50  . Years: 25.00  . Pack years: 12.50  . Types: Cigarettes  Smokeless Tobacco Current User    Objective: BP 120/80   Pulse (!) 58   SpO2 99%  Vitals and nursing notes reviewed  Physical Exam Gen: Alert and Oriented x 3, NAD HEENT: Normocephalic, atraumatic Neck: trachea midline, no thyroidmegaly, no LAD CV: RRR, no murmurs, normal S1, S2 split Resp: CTAB, no wheezing, rales, or rhonchi, comfortable work of breathing MSK: Right Shoulder Exam: No obvious deformity or skin discoloration or ecchymosis, Non-TTP at the [facility] joint and at the bicipital groove, FROM in external, internal rotation, flexion, extension, adduction, and abduction, +2 distal radius pulse, gross sensation intact, negative Hawkins, empty can, and neer test. Ext: no clubbing, cyanosis, or edema Skin: warm, dry, intact, no rashes  No results found for this or any previous visit (from the past 72 hour(s)).   Assessment/Plan:  Chronic right shoulder pain Right Shoulder Pain, most likely osteoarthitis has patient endorses a history of it. - Meloxicam 15mg  once per day with food for 2 weeks  Type 2 diabetes mellitus without complication ([place]) He has been off insulin for over 6 months now and his A1c is at goal at 6.5 for two consecutive visits. He is controlling his DM with exercise and diet. - Can push visits for A1c out to 6 months - Cont Metformin 1000mg  daily   PATIENT EDUCATION PROVIDED: See AVS    Diagnosis and plan along with any newly prescribed medication(s) were discussed in detail with this patient today. The patient verbalized understanding and agreed with the plan. Patient advised if symptoms worsen return to clinic or ER.     Orders Placed This Encounter  Procedures  . HgB A1c    Meds ordered this encounter  Medications  . meloxicam (MOBIC) 15 MG tablet    Sig: Take 1 tablet (15 mg total) by mouth daily.    Dispense:  14 tablet    Refill:  0     Tim Garlan Fillers, DO 02/17/2019, 10:50 AM PGY-2 [place]

## 2019-02-17 NOTE — Assessment & Plan Note (Signed)
Right Shoulder Pain, most likely osteoarthitis has patient endorses a history of it. - Meloxicam 15mg  once per day with food for 2 weeks

## 2019-02-21 NOTE — Assessment & Plan Note (Signed)
He has been off insulin for over 6 months now and his A1c is at goal at 6.5 for two consecutive visits. He is controlling his DM with exercise and diet. - Can push visits for A1c out to 6 months - Cont Metformin 1000mg  daily

## 2019-03-01 ENCOUNTER — Other Ambulatory Visit: Payer: Self-pay | Admitting: Family Medicine

## 2019-03-06 ENCOUNTER — Other Ambulatory Visit: Payer: Self-pay

## 2019-03-06 ENCOUNTER — Ambulatory Visit (INDEPENDENT_AMBULATORY_CARE_PROVIDER_SITE_OTHER): Payer: Medicaid Other | Admitting: Student in an Organized Health Care Education/Training Program

## 2019-03-06 VITALS — BP 128/72 | HR 73

## 2019-03-06 DIAGNOSIS — M25512 Pain in left shoulder: Secondary | ICD-10-CM | POA: Diagnosis not present

## 2019-03-06 MED ORDER — IBUPROFEN 600 MG PO TABS: 600.0000 mg | ORAL_TABLET | Freq: Three times a day (TID) | ORAL | 0 refills | Status: DC | PRN

## 2019-03-06 NOTE — Patient Instructions (Signed)
It was a pleasure seeing you today in our clinic.   I will call you with results of your Xray.   Our clinic's number is [phone]. Please call with questions or concerns about what we discussed today.  Be well, Dr. Burr Medico
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
## 2019-03-06 NOTE — Assessment & Plan Note (Signed)
Likely inflammation due to acute fall. Will order XR with weighted AP, lateral and axillary views to rule out acute fracture or AC dislocation - DG Shoulder Left; Future - ibuprofen (ADVIL) 600 MG tablet; Take 1 tablet (600 mg total) by mouth every 8 (eight) hours as needed.  Dispense: 30 tablet; Refill: 0

## 2019-04-03 ENCOUNTER — Other Ambulatory Visit: Payer: Self-pay | Admitting: Family Medicine

## 2019-04-18 ENCOUNTER — Ambulatory Visit
Admission: RE | Admit: 2019-04-18 | Discharge: 2019-04-18 | Disposition: A | Discharge status: Home / Self Care | Payer: Medicaid Other | Source: Ambulatory Visit | Attending: Family Medicine | Admitting: Family Medicine

## 2019-04-18 ENCOUNTER — Ambulatory Visit (INDEPENDENT_AMBULATORY_CARE_PROVIDER_SITE_OTHER): Payer: Medicaid Other | Admitting: Family Medicine

## 2019-04-18 ENCOUNTER — Other Ambulatory Visit: Payer: Self-pay

## 2019-04-18 DIAGNOSIS — M25512 Pain in left shoulder: Secondary | ICD-10-CM | POA: Diagnosis not present

## 2019-04-18 MED ORDER — IBUPROFEN 600 MG PO TABS: 600.0000 mg | ORAL_TABLET | Freq: Three times a day (TID) | ORAL | 0 refills | Status: DC | PRN

## 2019-04-18 NOTE — Assessment & Plan Note (Signed)
Persistent and secondary to trauma 1.5 months ago.  No point tenderness or deformities to concern for dislocation, passive range of motion intact though with pain.  Would recommend x-ray given persistent pain that was not present prior to his fall.  If x-ray negative for fracture, would recommend physical therapy for strengthening and range of motion exercises to prevent frozen shoulder.  Exercise handout given.  Prescription given for ibuprofen for as needed pain relief.  Patient verbalized understanding.

## 2019-04-18 NOTE — Patient Instructions (Signed)
It was great to see you!  Our plans for today:  - Take the ibuprofen as needed for pain. - Go to [facility] imaging between 8 AM and 4 PM for your shoulder x-ray.  We will get these results and call you with them. - If your x-ray does not show any bone breakage, we can consider muscle strengthening with physical therapy. -Try some of the exercises provided to help strengthen the muscles around your shoulder.  Take care and seek immediate care sooner if you develop any concerns.   Dr. [facility]
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
## 2019-04-19 ENCOUNTER — Other Ambulatory Visit: Payer: Self-pay | Admitting: Family Medicine

## 2019-04-19 DIAGNOSIS — M25512 Pain in left shoulder: Secondary | ICD-10-CM

## 2019-04-19 MED ORDER — MELOXICAM 15 MG PO TABS: 15.0000 mg | ORAL_TABLET | Freq: Every day | ORAL | 0 refills | Status: DC

## 2019-04-19 NOTE — Progress Notes (Signed)
Spoke with [name] informed him of results. [name] did state that he is interested in doing [name] and would like a referral placed. [name] also wanted to know if there is a stronger medication that could be Rx or recommend for pain? Salvatore Marvel, CMA

## 2019-06-18 ENCOUNTER — Other Ambulatory Visit: Payer: Self-pay | Admitting: Family Medicine

## 2019-06-18 DIAGNOSIS — M25512 Pain in left shoulder: Secondary | ICD-10-CM

## 2019-07-05 ENCOUNTER — Other Ambulatory Visit: Payer: Self-pay | Admitting: Family Medicine

## 2019-07-31 ENCOUNTER — Other Ambulatory Visit: Payer: Self-pay

## 2019-07-31 ENCOUNTER — Encounter: Payer: Self-pay | Admitting: Family Medicine

## 2019-07-31 ENCOUNTER — Ambulatory Visit (INDEPENDENT_AMBULATORY_CARE_PROVIDER_SITE_OTHER): Payer: Medicaid Other | Admitting: Family Medicine

## 2019-07-31 VITALS — BP 110/60 | HR 62 | Wt 187.8 lb

## 2019-07-31 DIAGNOSIS — M25512 Pain in left shoulder: Secondary | ICD-10-CM

## 2019-07-31 DIAGNOSIS — E119 Type 2 diabetes mellitus without complications: Secondary | ICD-10-CM | POA: Diagnosis not present

## 2019-07-31 DIAGNOSIS — G8929 Other chronic pain: Secondary | ICD-10-CM | POA: Diagnosis not present

## 2019-07-31 DIAGNOSIS — E785 Hyperlipidemia, unspecified: Secondary | ICD-10-CM

## 2019-07-31 DIAGNOSIS — Z0000 Encounter for general adult medical examination without abnormal findings: Secondary | ICD-10-CM | POA: Diagnosis not present

## 2019-07-31 LAB — POCT GLYCOSYLATED HEMOGLOBIN (HGB A1C): HbA1c, POC (controlled diabetic range): 6.1 % (ref 0.0–7.0)

## 2019-07-31 MED ORDER — METHYLPREDNISOLONE ACETATE 40 MG/ML IJ SUSP
40.0000 mg | Freq: Once | INTRAMUSCULAR | Status: AC
  Administered 2019-07-31: 40 mg via INTRAMUSCULAR

## 2019-07-31 MED ORDER — LACTULOSE 10 GM/15ML PO SOLN: 10.0000 g | Freq: Every day | ORAL | 2 refills | Status: DC | PRN

## 2019-07-31 MED ORDER — ACCU-CHEK AVIVA PLUS W/DEVICE KIT: 1.0000 | PACK | Freq: Every day | 0 refills | Status: DC

## 2019-07-31 MED ORDER — ATORVASTATIN CALCIUM 20 MG PO TABS: 20.0000 mg | ORAL_TABLET | Freq: Every day | ORAL | 0 refills | Status: DC

## 2019-07-31 MED ORDER — DOCUSATE SODIUM 100 MG PO CAPS: 100.0000 mg | ORAL_CAPSULE | Freq: Every day | ORAL | 0 refills | Status: DC

## 2019-07-31 MED ORDER — METFORMIN HCL 500 MG PO TABS: 500.0000 mg | ORAL_TABLET | Freq: Two times a day (BID) | ORAL | 0 refills | Status: DC

## 2019-07-31 MED ORDER — DICLOFENAC SODIUM 1 % EX GEL: 2.0000 g | Freq: Four times a day (QID) | CUTANEOUS | 1 refills | Status: DC

## 2019-07-31 NOTE — Patient Instructions (Signed)
It was great to meet you today! Thank you for letting me participate in your care!  Today, we discussed your diabetes and it is well controlled, keep up the good work.   For your continued left shoulder pain I have given you a cream to use four times per day and I will set up for you to go to physical therapy. I also gave you a corticosteroid shot today that will greatly help you with the pain.  Be well, Harolyn Rutherford, DO PGY-3, [facility]

## 2019-07-31 NOTE — Assessment & Plan Note (Signed)
X-rays negative and pain responded to NSAIDs high dose but returned. I suspect he has rotator cuff tendonitis and some AC Joint arthropathy. Would not recommend use of narcotics. - 4:1 subacromial injection performed today - Voltaren Gel prn - Formal PT prescribed today - F/u in 3 months

## 2019-07-31 NOTE — Progress Notes (Addendum)
Subjective: Chief Complaint  Patient presents with  . Diabetes     HPI: [name] is a 63 y.o. presenting to clinic today to discuss the following:  DM Follow UP Patient reports no neuropathy, no changes in vision, no headaches, episodes of near syncope or issues with taking Metformin. No diarrhea or upset stomach, taking Metformin as prescribed.  Left Shoulder Pain Patient continues to have left shoulder pain after a fall with negative x-rays. He can move it but states it hurts. I started him on high dose NSAIDs for two weeks which helped but "it then came back". He has not done formal PT. Patient asks for Percocet but I politely redirected to offer him alternatives for better care.    HLD Well controlled on statin, no muscle aches or cramps.  ROS noted in HPI.    Social History   Tobacco Use  Smoking Status Current Every Day Smoker  . Packs/day: 0.50  . Years: 25.00  . Pack years: 12.50  . Types: Cigarettes  Smokeless Tobacco Current User   Objective: BP 110/60   Pulse 62   Wt 187 lb 12.8 oz (85.2 kg)   SpO2 100%   BMI 23.47 kg/m  Vitals and nursing notes reviewed  Physical Exam Shoulder, left: TTP noted at the [place]. No evidence of bony deformity, asymmetry, or muscle atrophy; No tenderness over long head of biceps (bicipital groove). TTP at [facility] joint. Full active and passive range of motion (180 flex Huel Cote /150Abd /90ER /70IR), Strength 5/5 throughout. Sensation intact. Peripheral pulses intact.  Special Tests:   - Crossarm test: NEG   - Empty can: NEG   - Hawkins: NEG   - Neer test: NEG   - Obrien's test: NEG   - Crank test: NEG  Results for orders placed or performed in visit on 07/31/19 (from the past 72 hour(s))  HgB A1c     Status: None   Collection Time: 07/31/19  8:55 AM  Result Value Ref Range   Hemoglobin A1C     HbA1c POC (<> result, manual entry)     HbA1c, POC (prediabetic range)     HbA1c, POC (controlled diabetic range)  6.1 0.0 - 7.0 %   Procedure: Left shoulder subacromial bursa injection using a posterior approach with insertion point located 2cm below and 2 cm medial to the lateral posterior edge of the acromion. Consent obtained and verified. Time-out conducted. Noted no overlying erythema, induration, or other signs of local infection. Skin prepped in a sterile fashion. Topical analgesic spray: Ethyl chloride. Joint: Left shoulder Needle: 25gauge Completed without difficulty. Meds: 4:1 methylprednisolone to lidocaine  Advised to call if fevers/chills, erythema, induration, drainage, or persistent bleeding.  Assessment/Plan:  Hyperlipidemia Stable, well controlled on statin - Cont Atorvastatin 16m daily  Type 2 diabetes mellitus without complication (HCC) Stable, well controlled on Metformin.  - Refilled glucose meter per patient request - Refilled Metformin  Left shoulder pain X-rays negative and pain responded to NSAIDs high dose but returned. I suspect he has rotator cuff tendonitis and some AC Joint arthropathy. Would not recommend use of narcotics. - 4:1 subacromial injection performed today - Voltaren Gel prn - Formal PT prescribed today - F/u in 3 months   PATIENT EDUCATION PROVIDED: See AVS    Diagnosis and plan along with any newly prescribed medication(s) were discussed in detail with this patient today. The patient verbalized understanding and agreed with the plan. Patient advised if symptoms worsen return to  clinic or ER.   Health Maintainance: Diabetic Foot exam, urine creatinine/albumin, HIV screen, colonoscopy, declines flu shot   Orders Placed This Encounter  Procedures  . Microalbumin/Creatinine Ratio, Urine  . Basic Metabolic Panel  . HIV antibody (with reflex)  . Ambulatory referral to Gastroenterology    Referral Priority:   Routine    Referral Type:   Consultation    Referral Reason:   Specialty Services Required    Number of Visits Requested:   1  .  Ambulatory referral to Physical Therapy    Referral Priority:   Routine    Referral Type:   Physical Medicine    Referral Reason:   Specialty Services Required    Requested Specialty:   Physical Therapy    Number of Visits Requested:   1  . HgB A1c    Meds ordered this encounter  Medications  . diclofenac Sodium (VOLTAREN) 1 % GEL    Sig: Apply 2 g topically 4 (four) times daily.    Dispense:  350 g    Refill:  1  . atorvastatin (LIPITOR) 20 MG tablet    Sig: Take 1 tablet (20 mg total) by mouth daily.    Dispense:  180 tablet    Refill:  0  . docusate sodium (COLACE) 100 MG capsule    Sig: Take 1 capsule (100 mg total) by mouth daily.    Dispense:  10 capsule    Refill:  0  . lactulose (CHRONULAC) 10 GM/15ML solution    Sig: Take 15 mLs (10 g total) by mouth daily as needed for mild constipation.    Dispense:  480 mL    Refill:  2  . metFORMIN (GLUCOPHAGE) 500 MG tablet    Sig: Take 1 tablet (500 mg total) by mouth 2 (two) times daily with a meal.    Dispense:  180 tablet    Refill:  0  . Blood Glucose Monitoring Suppl (ACCU-CHEK AVIVA PLUS) w/Device KIT    Sig: 1 kit by Does not apply route daily.    Dispense:  1 kit    Refill:  0  . methylPREDNISolone acetate (DEPO-MEDROL) injection 40 mg    Harolyn Rutherford, DO 07/31/2019, 9:23 AM PGY-3 [place]

## 2019-07-31 NOTE — Assessment & Plan Note (Signed)
Stable, well controlled on statin - Cont Atorvastatin 20mg  daily

## 2019-07-31 NOTE — Assessment & Plan Note (Signed)
Stable, well controlled on Metformin.  - Refilled glucose meter per patient request - Refilled Metformin

## 2019-08-01 LAB — BASIC METABOLIC PANEL
BUN/Creatinine Ratio: 14 (ref 10–24)
BUN: 15 mg/dL (ref 8–27)
CO2: 25 mmol/L (ref 20–29)
Calcium: 10.3 mg/dL — ABNORMAL HIGH (ref 8.6–10.2)
Chloride: 101 mmol/L (ref 96–106)
Creatinine, Ser: 1.06 mg/dL (ref 0.76–1.27)
GFR calc Af Amer: 85 mL/min/{1.73_m2} (ref >59)
GFR calc non Af Amer: 74 mL/min/{1.73_m2} (ref >59)
Glucose: 112 mg/dL — ABNORMAL HIGH (ref 65–99)
Potassium: 5.1 mmol/L (ref 3.5–5.2)
Sodium: 139 mmol/L (ref 134–144)

## 2019-08-01 LAB — HIV ANTIBODY (ROUTINE TESTING W REFLEX): HIV Screen 4th Generation wRfx: NONREACTIVE

## 2019-08-02 LAB — MICROALBUMIN / CREATININE URINE RATIO
Creatinine, Urine: 110.3 mg/dL
Microalb/Creat Ratio: <3 mg/g creat (ref 0–29)
Microalbumin, Urine: <3 ug/mL

## 2019-08-16 ENCOUNTER — Ambulatory Visit: Payer: Medicaid Other | Admitting: Family Medicine

## 2019-10-23 ENCOUNTER — Encounter: Payer: Self-pay | Admitting: Family Medicine

## 2019-10-23 ENCOUNTER — Ambulatory Visit (INDEPENDENT_AMBULATORY_CARE_PROVIDER_SITE_OTHER): Payer: Medicaid Other | Admitting: Family Medicine

## 2019-10-23 ENCOUNTER — Other Ambulatory Visit: Payer: Self-pay

## 2019-10-23 VITALS — BP 116/74 | HR 60 | Wt 189.0 lb

## 2019-10-23 DIAGNOSIS — Z0000 Encounter for general adult medical examination without abnormal findings: Secondary | ICD-10-CM

## 2019-10-23 DIAGNOSIS — H900 Conductive hearing loss, bilateral: Secondary | ICD-10-CM

## 2019-10-23 DIAGNOSIS — E119 Type 2 diabetes mellitus without complications: Secondary | ICD-10-CM

## 2019-10-23 LAB — POCT GLYCOSYLATED HEMOGLOBIN (HGB A1C): HbA1c, POC (controlled diabetic range): 6.3 % (ref 0.0–7.0)

## 2019-10-23 NOTE — Assessment & Plan Note (Signed)
Patient with long standing chronic hearing loss that occurred before establishing care with our practice.  - Placed referral with audiology for evaluation for hearing aids.

## 2019-10-23 NOTE — Progress Notes (Signed)
   CHIEF COMPLAINT / HPI: Hearing Loss, Diabetes follow up  Hearing Loss Patient has long standing hearing loss. He has had this for "many years" long before he established care here at [facility]. He now has Medicare and wishes to have hearing aids. He has bought over the counter hearing aids at [employer] but states they do not work well. He is requesting a referral for hearing aids.  DM Follow up Patient is well controlled T2DM that has been well controlled on Metformin. He is taking his medication daily. His A1c today is 6.3% which is to goal. He has no side effects from his medications such as nausea, upset stomach, or diarrhea. He has no difficulty obtaining his medications.   PERTINENT  PMH / PSH: T2DM, Hearing Impairment  OBJECTIVE: BP 116/74   Pulse 60   Wt 189 lb (85.7 kg)   SpO2 99%   BMI 23.62 kg/m   Gen: Alert and Oriented x 3, NAD Ext: no clubbing, cyanosis, or edema Skin: warm, dry, intact, no rashes  ASSESSMENT / PLAN:  Conductive hearing loss, bilateral Patient with long standing chronic hearing loss that occurred before establishing care with our practice.  - Placed referral with audiology for evaluation for hearing aids.  Type 2 diabetes mellitus without complication (HCC) Stable, well controlled on Metformin 500mg  BID, no side effects reported.  - Cont Metformin  - F/u in 3 months     Nuala Alpha, DO, PGY-3 [place]

## 2019-10-23 NOTE — Patient Instructions (Addendum)
It was great to see you today! Thank you for letting me participate in your care!  Today, we discussed your hearing loss and I have referred you to be evaluated for hearing aids. The audiologist will be in contact with you to set up a time and date.  I have also made a referral for you to have a colonoscopy. They will call you with a date and time for your appointment.  Be well, Shane Rutherford, DO PGY-3, [facility]

## 2019-10-23 NOTE — Assessment & Plan Note (Signed)
Stable, well controlled on Metformin 500mg  BID, no side effects reported.  - Cont Metformin  - F/u in 3 months

## 2019-10-26 ENCOUNTER — Encounter: Payer: Self-pay | Admitting: Family Medicine

## 2019-12-04 ENCOUNTER — Other Ambulatory Visit: Payer: Self-pay | Admitting: Family Medicine

## 2019-12-05 ENCOUNTER — Other Ambulatory Visit: Payer: Self-pay | Admitting: Family Medicine

## 2019-12-05 NOTE — Telephone Encounter (Signed)
Pt came to office asking if meds have been refilled and said he would like for me to send a note back. Thanks

## 2020-01-23 ENCOUNTER — Encounter: Payer: Self-pay | Admitting: Family Medicine

## 2020-01-23 ENCOUNTER — Ambulatory Visit (INDEPENDENT_AMBULATORY_CARE_PROVIDER_SITE_OTHER): Payer: Medicare Other | Admitting: Family Medicine

## 2020-01-23 ENCOUNTER — Other Ambulatory Visit: Payer: Self-pay

## 2020-01-23 VITALS — BP 124/80 | HR 62 | Ht 75.0 in | Wt 194.0 lb

## 2020-01-23 DIAGNOSIS — H900 Conductive hearing loss, bilateral: Secondary | ICD-10-CM | POA: Diagnosis not present

## 2020-01-23 DIAGNOSIS — Z0000 Encounter for general adult medical examination without abnormal findings: Secondary | ICD-10-CM | POA: Diagnosis not present

## 2020-01-23 DIAGNOSIS — F17200 Nicotine dependence, unspecified, uncomplicated: Secondary | ICD-10-CM | POA: Diagnosis not present

## 2020-01-23 DIAGNOSIS — E119 Type 2 diabetes mellitus without complications: Secondary | ICD-10-CM

## 2020-01-23 LAB — POCT GLYCOSYLATED HEMOGLOBIN (HGB A1C): HbA1c, POC (prediabetic range): 6.2 % (ref 5.7–6.4)

## 2020-01-23 MED ORDER — ATORVASTATIN CALCIUM 20 MG PO TABS: 20.0000 mg | ORAL_TABLET | Freq: Every day | ORAL | 1 refills | Status: DC

## 2020-01-23 MED ORDER — METFORMIN HCL 500 MG PO TABS: ORAL_TABLET | ORAL | 1 refills | Status: DC

## 2020-01-23 NOTE — Assessment & Plan Note (Signed)
Chronic, unchanged. Patient was evaluated by Audiology and found to have asymmetric conductive hearing loss R>L. Audiology recommended evaluation by ENT before being fit for hearing aids. - Referral made to ENT

## 2020-01-23 NOTE — Assessment & Plan Note (Signed)
Stable, well controlled on Metformin 500mg  BID daily. A1c today at 6.2%.  - Cont Metformin at current dose - F/u in 3 months

## 2020-01-23 NOTE — Progress Notes (Addendum)
    SUBJECTIVE:   CHIEF COMPLAINT / HPI:   T2DM Doing well, controlled on Metformin 500mg  BID. He is taking it without complications. He checks his blood sugar in the morning before meals and states it is usually around 120-150. He has had no dizziness, syncope, or near syncope.   Healthcare Maintenance Declines 2nd dose of PNA vaccine today, says he will get it next time. Agrees to colonoscopy, diabetic eye exam.  Chronic Bilateral Hearing Loss Patient was seen by Audiologist in [facility] and found to have bilateral asymmetric hearing loss. This is a chronic condition for him and remains unchanged, worse on the right than the left but this is also unchanged from previous. His audiologist suggested he be seen and evaluated by an ENT before being fit for hearing aids. I have placed a referral.  Tobacco Dependence Patient expresses desire to quit but failed NRT. He is willing to meet with Dr. Valentina Lucks to discuss options and to see if he may qualify for programs to help pay for NRT.  PERTINENT  PMH / PSH: T2DM, Conductive Hearing loss  OBJECTIVE:   BP 124/80   Pulse 62   Ht 6\' 3"  (1.905 m)   Wt 194 lb (88 kg)   SpO2 99%   BMI 24.25 kg/m   Gen: NAD Resp: CTAB, no wheezing, rales, or rhonchi, comfortable work of breathing Ext: no clubbing, cyanosis, or edema Skin: warm, dry, intact, no rashes  ASSESSMENT/PLAN:   Type 2 diabetes mellitus without complication (HCC) Stable, well controlled on Metformin 500mg  BID daily. A1c today at 6.2%.  - Cont Metformin at current dose - F/u in 3 months  Conductive hearing loss, bilateral Chronic, unchanged. Patient was evaluated by Audiology and found to have asymmetric conductive hearing loss R>L. Audiology recommended evaluation by ENT before being fit for hearing aids. - Referral made to ENT  Tobacco dependence Patient expressing desire to quit but struggles financially to afford NRT. - Interested in starting prescription medication but  wants further discussion. - Referral to Dr. Valentina Lucks for tobacco cessation counseling  Hypercalcemia Elevated slightly on two separate readings - Repeat BMP at next appointment in 3 months. - If elevated order PTH labs, if those are abnormal referral to endocrine      Nuala Alpha, [place]

## 2020-01-23 NOTE — Assessment & Plan Note (Signed)
Patient expressing desire to quit but struggles financially to afford NRT. - Interested in starting prescription medication but wants further discussion. - Referral to Dr. Valentina Lucks for tobacco cessation counseling

## 2020-01-23 NOTE — Patient Instructions (Addendum)
It was great to see you today! Thank you for letting me participate in your care!  Today, we discussed your diabetes which is under great control. Please keep it up! I have made several referrals. Please be sure to answer your phone as they will be calling you to set up the appointments you need. It is very important for your health to make sure you get to these appointments. Please call me if you have any questions or difficulties in making these appointments.  I have referred you to have a colonoscopy, a diabetic eye exam, and an evaluation at ENT due to asymmetric hearing loss.  Be well, Harolyn Rutherford, DO PGY-3, [facility]

## 2020-01-24 HISTORY — DX: Hypercalcemia: E83.52

## 2020-01-24 NOTE — Assessment & Plan Note (Signed)
Elevated slightly on two separate readings - Repeat BMP at next appointment in 3 months. - If elevated order PTH labs, if those are abnormal referral to endocrine

## 2020-01-29 ENCOUNTER — Other Ambulatory Visit: Payer: Self-pay | Admitting: Family Medicine

## 2020-01-30 ENCOUNTER — Other Ambulatory Visit: Payer: Self-pay

## 2020-02-26 ENCOUNTER — Encounter: Payer: Self-pay | Admitting: Family Medicine

## 2020-03-04 ENCOUNTER — Other Ambulatory Visit: Payer: Self-pay | Admitting: Family Medicine

## 2020-03-15 ENCOUNTER — Other Ambulatory Visit: Payer: Self-pay | Admitting: Family Medicine

## 2020-04-09 NOTE — Patient Instructions (Signed)
It was nice meeting you  today!  You should pay attention to your hemoglobin A1C.  It is a three month test about your average blood sugar. If the A1C is - <7.0 is great.  That is our goal for treating you. - Between 7.0 and 9.0 is not so good.  We would need to work to do better. - Above 9.0 is terrible.  You would really need to work with [place] to get it under control.    Today's A1C = 6.1   If you have any questions or concerns, please feel free to call the clinic.   Be well,  Carollee Leitz, MD [facility]

## 2020-04-09 NOTE — Progress Notes (Signed)
    SUBJECTIVE:   CHIEF COMPLAINT / HPI: for check up  Chronic Diabetes Denies any chest pain, SOB, dizziness or abdominal pain. Disease Monitoring  Blood Sugar Ranges: good, doesn't take them daily  Polyuria: no   Visual problems: no   Last hemoglobin A1C:  Lab Results  Component Value Date   HGBA1C 6.1 04/10/2020    Medication Compliance: yes  Medication Side Effects  Hypoglycemia: no   Preventitive Health Care  Eye Exam: referral sent today  Foot Exam: referral sent today  Diet pattern: no specific diet  Exercise: stays active   Refuses COVID vaccine      PERTINENT  PMH / PSH: DM Type 2, Bilateral hearing loss  OBJECTIVE:   BP 110/70   Pulse 65   Ht 6\' 3"  (1.905 m)   Wt 198 lb (89.8 kg)   SpO2 98%   BMI 24.75 kg/m   General: Alert and oriented, no apparent distress  ENTM: Cerumen blocking Lt TM, Rt TM visible, No pharyengeal erythema Cardiovascular: RRR with no murmurs noted Respiratory: CTA bilaterally  Gastrointestinal: Bowel sounds present. No abdominal pain MSK: Upper extremity strength 5/5 bilaterally, Lower extremity strength 5/5 bilaterally   ASSESSMENT/PLAN:   Type 2 diabetes mellitus without complication ([place]) Doing well.  No acute concerns -HbA1c 6.1 today -Continue Metformin  -Lipid Panel and Bmet today -Refer to Podiatry -Refer to Ophthalmology -Follow up in 3 months or sooner if needed  Healthcare maintenance Refuses COVID vaccine -Refer to GI for Colonoscopy -Will discuss PNA vaccine at next visit -Follow as needed  Tobacco dependence Patient reports interest in smoking cessation -Advised to make appointment with Dr. Valentina Lucks to start process -Will follow up with appointment     Carollee Leitz, MD [place]

## 2020-04-10 ENCOUNTER — Ambulatory Visit (INDEPENDENT_AMBULATORY_CARE_PROVIDER_SITE_OTHER): Payer: Medicare Other | Admitting: Family Medicine

## 2020-04-10 ENCOUNTER — Other Ambulatory Visit: Payer: Self-pay

## 2020-04-10 ENCOUNTER — Encounter: Payer: Self-pay | Admitting: Family Medicine

## 2020-04-10 VITALS — BP 110/70 | HR 65 | Ht 75.0 in | Wt 198.0 lb

## 2020-04-10 DIAGNOSIS — Z0000 Encounter for general adult medical examination without abnormal findings: Secondary | ICD-10-CM | POA: Insufficient documentation

## 2020-04-10 DIAGNOSIS — F17200 Nicotine dependence, unspecified, uncomplicated: Secondary | ICD-10-CM

## 2020-04-10 DIAGNOSIS — E119 Type 2 diabetes mellitus without complications: Secondary | ICD-10-CM

## 2020-04-10 LAB — POCT GLYCOSYLATED HEMOGLOBIN (HGB A1C): HbA1c, POC (controlled diabetic range): 6.1 % (ref 0.0–7.0)

## 2020-04-10 NOTE — Assessment & Plan Note (Addendum)
Refuses COVID vaccine -Refer to GI for Colonoscopy -Will discuss PNA vaccine and Tdapt at next visit -Follow as needed

## 2020-04-10 NOTE — Assessment & Plan Note (Signed)
Patient reports interest in smoking cessation -Advised to make appointment with Dr. Valentina Lucks to start process -Will follow up with appointment

## 2020-04-10 NOTE — Assessment & Plan Note (Signed)
Doing well.  No acute concerns -HbA1c 6.1 today -Continue Metformin  -Lipid Panel and Bmet today -Refer to Podiatry -Refer to Ophthalmology -Follow up in 3 months or sooner if needed

## 2020-04-11 LAB — BASIC METABOLIC PANEL
BUN/Creatinine Ratio: 15 (ref 10–24)
BUN: 15 mg/dL (ref 8–27)
CO2: 22 mmol/L (ref 20–29)
Calcium: 9.8 mg/dL (ref 8.6–10.2)
Chloride: 107 mmol/L — ABNORMAL HIGH (ref 96–106)
Creatinine, Ser: 0.99 mg/dL (ref 0.76–1.27)
GFR calc Af Amer: 92 mL/min/{1.73_m2} (ref >59)
GFR calc non Af Amer: 80 mL/min/{1.73_m2} (ref >59)
Glucose: 132 mg/dL — ABNORMAL HIGH (ref 65–99)
Potassium: 4.3 mmol/L (ref 3.5–5.2)
Sodium: 142 mmol/L (ref 134–144)

## 2020-04-11 LAB — LIPID PANEL
Chol/HDL Ratio: 2.8 ratio (ref 0.0–5.0)
Cholesterol, Total: 112 mg/dL (ref 100–199)
HDL: 40 mg/dL (ref >39)
LDL Chol Calc (NIH): 54 mg/dL (ref 0–99)
Triglycerides: 94 mg/dL (ref 0–149)
VLDL Cholesterol Cal: 18 mg/dL (ref 5–40)

## 2020-05-01 ENCOUNTER — Other Ambulatory Visit: Payer: Self-pay | Admitting: Family Medicine

## 2020-06-08 ENCOUNTER — Other Ambulatory Visit: Payer: Self-pay | Admitting: Family Medicine

## 2020-07-03 NOTE — Patient Instructions (Addendum)
Thank you for coming to see me today. It was a pleasure.   You should pay attention to your hemoglobin A1C.  It is a three month test about your average blood sugar. If the A1C is - <7.0 is great.  That is our goal for treating you. - Between 7.0 and 9.0 is not so good.  We would need to work to do better. - Above 9.0 is terrible.  You would really need to work with [place] to get it under control.    Today's A1C = 6.8  Foot Doctor appointment on Nov 9 at [facility], [place], [place] [address] [phone]  Referral for Colonoscopy sent.  They will call you with an appointment Referral for Ophthalmology sent.  They will call you with an appointment  I encourage you to have the Pneumonia, Shingles and Flu vaccines.  Please let me know if you would like to have these.   Please follow-up with me in 3 months  If you have any questions or concerns, please do not hesitate to call the office at [phone].  Best,    Carollee Leitz, MD Family [facility]

## 2020-07-03 NOTE — Progress Notes (Signed)
    SUBJECTIVE:   CHIEF COMPLAINT / HPI:   Diabetes  Compliant with medications  Exercise: walks Eye exam: Ordered Foot exam: Seeing Podiatry 11/21 A1C: 6.8 Symptoms: Denies any symptoms of hypoglycemia. No symptoms of  polyuria, polydipsia. Denies any numbness in extremities, and has no foot ulcers/trauma Meds: Metformin 500 mg twice daily, Atorvastatin 83m daily Pneumonia, Flu and Shingles vaccine: Declined Urine micro albumin:creatine ratio: 11/20 wnl  Monitoring Labs and Parameters Last A1C:  Lab Results  Component Value Date   HGBA1C 6.8 (A) 07/05/2020   Last Lipid:     Component Value Date/Time   CHOL 112 04/10/2020 1026   HDL 40 04/10/2020 1026   Last Bmet  Potassium  Date Value Ref Range Status  04/10/2020 4.3 3.5 - 5.2 mmol/L Final   Sodium  Date Value Ref Range Status  04/10/2020 142 134 - 144 mmol/L Final   Creatinine, Ser  Date Value Ref Range Status  04/10/2020 0.99 0.76 - 1.27 mg/dL Final      PERTINENT  PMH / PSH:  DM Type 2  OBJECTIVE:   BP 120/88   Pulse 65   Ht _0  (1.905 m)   Wt 203 lb 3.2 oz (92.2 kg)   SpO2 96%   BMI 25.40 kg/m    General: Alert and oriented, no apparent distress  Cardiovascular: RRR with no murmurs noted Respiratory: CTA bilaterally  Gastrointestinal: Bowel sounds present. No abdominal pain   ASSESSMENT/PLAN:   Type 2 diabetes mellitus without complication (HCC) [id number] today, slightly elevated from previous visit. Continue Metformin 500 mg BID Diet and exercise Ophthalmology referral Podiatry appointment 11/21 Refill Lipitor 20 mg  Blood glucose monitoring kit Recheck in 3 months Consider adding low dose Losartan at next visit  Health care maintenance Declined PNA, Flu and Shingles vaccines Referral for colonoscopy Request that patient get record of COVID vaccine to update chart at next visit.      TCarollee Leitz MD [place]

## 2020-07-05 ENCOUNTER — Other Ambulatory Visit: Payer: Self-pay

## 2020-07-05 ENCOUNTER — Encounter: Payer: Self-pay | Admitting: Family Medicine

## 2020-07-05 ENCOUNTER — Ambulatory Visit (INDEPENDENT_AMBULATORY_CARE_PROVIDER_SITE_OTHER): Payer: Medicare Other | Admitting: Family Medicine

## 2020-07-05 ENCOUNTER — Other Ambulatory Visit: Payer: Self-pay | Admitting: Family Medicine

## 2020-07-05 VITALS — BP 120/88 | HR 65 | Ht 75.0 in | Wt 203.2 lb

## 2020-07-05 DIAGNOSIS — K5900 Constipation, unspecified: Secondary | ICD-10-CM

## 2020-07-05 DIAGNOSIS — E119 Type 2 diabetes mellitus without complications: Secondary | ICD-10-CM | POA: Diagnosis not present

## 2020-07-05 DIAGNOSIS — Z0000 Encounter for general adult medical examination without abnormal findings: Secondary | ICD-10-CM

## 2020-07-05 DIAGNOSIS — Z1211 Encounter for screening for malignant neoplasm of colon: Secondary | ICD-10-CM

## 2020-07-05 LAB — POCT GLYCOSYLATED HEMOGLOBIN (HGB A1C): Hemoglobin A1C: 6.8 % — AB (ref 4.0–5.6)

## 2020-07-05 MED ORDER — ADVOCATE BLOOD GLUCOSE SYSTEM W/DEVICE KIT: PACK | 0 refills | Status: DC

## 2020-07-05 MED ORDER — ATORVASTATIN CALCIUM 20 MG PO TABS: 20.0000 mg | ORAL_TABLET | Freq: Every day | ORAL | 1 refills | Status: DC

## 2020-07-05 MED ORDER — LACTULOSE 10 GM/15ML PO SOLN: ORAL | 0 refills | Status: DC

## 2020-07-05 NOTE — Telephone Encounter (Signed)
resent

## 2020-07-08 ENCOUNTER — Encounter: Payer: Self-pay | Admitting: Family Medicine

## 2020-07-08 DIAGNOSIS — Z0100 Encounter for examination of eyes and vision without abnormal findings: Secondary | ICD-10-CM | POA: Diagnosis not present

## 2020-07-08 DIAGNOSIS — E119 Type 2 diabetes mellitus without complications: Secondary | ICD-10-CM | POA: Diagnosis not present

## 2020-07-08 NOTE — Assessment & Plan Note (Signed)
Declined PNA, Flu and Shingles vaccines Referral for colonoscopy Request that patient get record of COVID vaccine to update chart at next visit.

## 2020-07-08 NOTE — Assessment & Plan Note (Signed)
HbA1c 6.8 today, slightly elevated from previous visit. Continue Metformin 500 mg BID Diet and exercise Ophthalmology referral Podiatry appointment 11/21 Refill Lipitor 20 mg  Blood glucose monitoring kit Recheck in 3 months Consider adding low dose Losartan at next visit

## 2020-07-23 ENCOUNTER — Ambulatory Visit (INDEPENDENT_AMBULATORY_CARE_PROVIDER_SITE_OTHER): Payer: Medicare Other | Admitting: Podiatry

## 2020-07-23 ENCOUNTER — Encounter: Payer: Self-pay | Admitting: Podiatry

## 2020-07-23 ENCOUNTER — Other Ambulatory Visit: Payer: Self-pay

## 2020-07-23 DIAGNOSIS — M2010 Hallux valgus (acquired), unspecified foot: Secondary | ICD-10-CM | POA: Insufficient documentation

## 2020-07-23 HISTORY — DX: Hallux valgus (acquired), unspecified foot: M20.10

## 2020-07-23 NOTE — Progress Notes (Addendum)
This patient presents  to my office for at risk foot care.  This patient requires this care by a professional since this patient will be at risk due to having diabetes for about 4 years.  He presents to the office for diabetic foot exam.  Patient says he has poor hearing.   This patient presents for at risk foot care today.  General Appearance  Alert, conversant and in no acute stress.  Vascular  Dorsalis pedis   pulse  are weakly  palpable  Left.  The posterior tibial pulse is absent left.  Dorsalis pedis are palpable right foot.  Posterior tibial  Pulse are absent  Right foot..  Capillary return is within normal limits  bilaterally. Temperature is within normal limits  bilaterally.  Neurologic  Senn-Weinstein monofilament wire test within normal limits  bilaterally. Muscle power within normal limits bilaterally.  Nails Thick disfigured discolored nails with subungual debris  from hallux to fifth toes bilaterally. No evidence of bacterial infection or drainage bilaterally.  Orthopedic  No limitations of motion  feet .  No crepitus or effusions noted.  No bony pathology or digital deformities noted.HAV  B/L. \ Skin  normotropic skin with no porokeratosis noted bilaterally.  No signs of infections or ulcers noted.      Diabetes mellitus  HAV  B/L.  Consent was obtained for treatment procedures.  Diabetic exam reveals normal  LOPS with diminished vascular status both feet.  Blood pressure was elevated and he was told to talk with his medical doctor.   Return office visit    1 year for annual exam.                 Told patient to return for periodic foot care and evaluation due to potential at risk complications.   Gardiner Barefoot DPM

## 2020-07-24 ENCOUNTER — Other Ambulatory Visit: Payer: Self-pay

## 2020-07-24 ENCOUNTER — Encounter (HOSPITAL_COMMUNITY): Payer: Self-pay

## 2020-07-24 ENCOUNTER — Ambulatory Visit (HOSPITAL_COMMUNITY)
Admission: EM | Admit: 2020-07-24 | Discharge: 2020-07-24 | Disposition: A | Discharge status: Home / Self Care | Payer: Medicare Other

## 2020-07-24 DIAGNOSIS — R030 Elevated blood-pressure reading, without diagnosis of hypertension: Secondary | ICD-10-CM | POA: Diagnosis not present

## 2020-07-24 HISTORY — DX: Type 2 diabetes mellitus without complications: E11.9

## 2020-07-24 HISTORY — DX: Hyperlipidemia, unspecified: E78.5

## 2020-07-24 NOTE — ED Triage Notes (Signed)
Pt presented to orthopedist yesterday for a regular check-up and was told to follow up due to "high blood pressure". Pt is not currently on any BP meds.

## 2020-07-24 NOTE — ED Provider Notes (Signed)
[facility]    CSN: [id number] Arrival date & time: 07/24/20  8341      History   Chief Complaint Chief Complaint  Patient presents with  . Hypertension    HPI Brennan Litzinger is a 64 y.o. male who presents due to having elevated blood pressure at the ortho visit yesterday and was told to have a visit for this. He denies HA, CP, SOB, edema. Admits he had dental extraction 2 weeks ago and has been taking Ibuprofen and Tylenol for pain, plus antibiotics. His dental pain is gone. He also admits drinks a lot of diet soda and been eating more pork.  His fasting glucose 2 days ago was 126. His next PCP visit is next month.   Past Medical History:  Diagnosis Date  . Diabetes mellitus without complication ([place])   . Hyperlipidemia     Patient Active Problem List   Diagnosis Date Noted  . Hav (hallux abducto valgus), unspecified laterality 07/23/2020  . Health care maintenance 04/10/2020  . Hypercalcemia 01/24/2020  . Tobacco dependence 01/23/2020  . Conductive hearing loss, bilateral 10/23/2019  . Left shoulder pain 03/06/2019  . Chronic right shoulder pain 02/17/2019  . Cough 08/31/2018  . Arthritis 08/31/2018  . Hyperlipidemia 06/07/2018  . Type 2 diabetes mellitus without complication ([place]) 96/22/2979  . Constipation 06/07/2018    No past surgical history on file.     Home Medications    Prior to Admission medications   Medication Sig Start Date End Date Taking? Authorizing Provider  atorvastatin (LIPITOR) 20 MG tablet Take 1 tablet (20 mg total) by mouth daily. 07/05/20   Carollee Leitz, MD  diclofenac Sodium (VOLTAREN) 1 % GEL APPLY 2 GRAMS TOPICALLY FOUR TIMES DAILY 06/10/20   Carollee Leitz, MD  docusate sodium (COLACE) 100 MG capsule Take 1 capsule (100 mg total) by mouth daily. 07/31/19   Nuala Alpha, DO  glucose blood (IGLUCOSE TEST STRIPS) test strip Use as instructed 06/28/18   Lockamy, Timothy, DO  lactulose (CONSTULOSE) 10 GM/15ML solution TAKE 15  ML BY MOUTH DAILY AS NEEDED FOR MILD CONSTIPATION 07/05/20   Carollee Leitz, MD  metFORMIN (GLUCOPHAGE) 500 MG tablet TAKE 1 TABLET BY MOUTH TWICE DAILY WITH A MEAL 01/23/20   Nuala Alpha, DO    Family History Family History  Problem Relation Age of Onset  . Diabetes Mother     Social History Social History   Tobacco Use  . Smoking status: Current Every Day Smoker    Packs/day: 0.50    Years: 25.00    Pack years: 12.50    Types: Cigarettes  . Smokeless tobacco: Current User  Substance Use Topics  . Alcohol use: Never  . Drug use: Never     Allergies   Patient has no known allergies.   Review of Systems Review of Systems  Constitutional: Negative for appetite change, diaphoresis and fatigue.  HENT: Negative for dental problem.   Eyes: Negative for visual disturbance.  Respiratory: Negative for cough, chest tightness and shortness of breath.   Cardiovascular: Negative for chest pain and leg swelling.  Gastrointestinal: Negative for abdominal pain.  Musculoskeletal: Negative for myalgias.  Neurological: Negative for headaches.  Hematological: Negative for adenopathy.     Physical Exam Triage Vital Signs ED Triage Vitals  Enc Vitals Group     BP 07/24/20 0926 (!) 145/94     Pulse Rate 07/24/20 0926 63     Resp 07/24/20 0926 19     Temp 07/24/20 0926 98.2  F (36.8 C)     Temp Source 07/24/20 0926 Oral     SpO2 07/24/20 0926 99 %     Weight 07/24/20 0923 203 lb (92.1 kg)     Height 07/24/20 0923 6\' 3"  (1.905 m)     Head Circumference --      Peak Flow --      Pain Score 07/24/20 0923 0     Pain Loc --      Pain Edu? --      Excl. in [place]? --    No data found.  Updated Vital Signs BP (!) 145/94   Pulse 63   Temp 98.2 F (36.8 C) (Oral)   Resp 19   Ht 6\' 3"  (1.905 m)   Wt 203 lb (92.1 kg)   SpO2 99%   BMI 25.37 kg/m   Visual Acuity Right Eye Distance:   Left Eye Distance:   Bilateral Distance:    Right Eye Near:   Left Eye Near:      Bilateral Near:     Physical Exam Vitals and nursing note reviewed.  Constitutional:      General: He is not in acute distress.    Appearance: He is normal weight. He is not toxic-appearing.  HENT:     Head: Normocephalic.     Right Ear: External ear normal.     Left Ear: External ear normal.  Eyes:     General: No scleral icterus.    Conjunctiva/sclera: Conjunctivae normal.  Neck:     Vascular: No carotid bruit.  Cardiovascular:     Rate and Rhythm: Normal rate and regular rhythm.     Pulses: Normal pulses.     Comments: No edema Pulmonary:     Effort: Pulmonary effort is normal.     Breath sounds: Normal breath sounds.  Musculoskeletal:        General: Normal range of motion.     Cervical back: Neck supple.  Lymphadenopathy:     Cervical: No cervical adenopathy.  Skin:    General: Skin is warm and dry.  Neurological:     Mental Status: He is alert and oriented to person, place, and time.     Gait: Gait normal.  Psychiatric:        Mood and Affect: Mood normal.        Behavior: Behavior normal.        Thought Content: Thought content normal.        Judgment: Judgment normal.      UC Treatments / Results  Labs (all labs ordered are listed, but only abnormal results are displayed) Labs Reviewed - No data to display  EKG   Radiology No results found.  Procedures Procedures (including critical care time)  Medications Ordered in UC Medications - No data to display  Initial Impression / Assessment and Plan / UC Course  I have reviewed the triage vital signs and the nursing notes. Elevated blood pressure without hx of this per his last 4 visits prior to ortho yesterday. I believe this could be due to the Ibuprofen he has been taking and increased sodium in his diet. He was advised to do BP diaries and FU with his PCP in 2 weeks.   Final Clinical Impressions(s) / UC Diagnoses   Final diagnoses:  None   Discharge Instructions   None    ED  Prescriptions    None     PDMP not reviewed this encounter.   Rod[place], Sunday Spillers, [place]  07/24/20 0957  

## 2020-07-24 NOTE — Discharge Instructions (Signed)
Monitor your blood pressure every day, mid morning and write them done. If they are more than 140/90 three or more times you will need to be placed on medication by your family Dr. In the mean time, decrease your salt intake, do not eat pork or fast food since processed foods are high in sodium.  Just drink water, instead of soda, even if it is diet, since most of diet soda's are nigh in sodium Follow up with your family Dr and record the blood pressure reading to take to them. I believe right now is due to you healing from dental extraction, and taking Ibuprofen for 2 week. Only take Tyeenol for pain if needed.

## 2020-07-29 ENCOUNTER — Telehealth: Payer: Self-pay | Admitting: Family Medicine

## 2020-07-29 ENCOUNTER — Ambulatory Visit (INDEPENDENT_AMBULATORY_CARE_PROVIDER_SITE_OTHER): Payer: Medicare Other | Admitting: Family Medicine

## 2020-07-29 ENCOUNTER — Other Ambulatory Visit: Payer: Self-pay

## 2020-07-29 ENCOUNTER — Encounter: Payer: Self-pay | Admitting: Family Medicine

## 2020-07-29 DIAGNOSIS — R030 Elevated blood-pressure reading, without diagnosis of hypertension: Secondary | ICD-10-CM

## 2020-07-29 DIAGNOSIS — I10 Essential (primary) hypertension: Secondary | ICD-10-CM | POA: Insufficient documentation

## 2020-07-29 NOTE — Patient Instructions (Signed)
Your blood pressure is fine today.  It probably was those over the counter pain meds that you took for your teeth. No change in your meds Please get your COVID vaccine card so we can update your records. Let me know if you change about the flu shot.  I strongly recommend them.

## 2020-07-29 NOTE — Telephone Encounter (Signed)
Pt walked in and provided COVID -19 Vaccination Record Card to be added to chart; Placed in white team folder.

## 2020-07-29 NOTE — Assessment & Plan Note (Signed)
Transient elevation of BP was either due to pain from oral surg or from the ibuprofen.  No change for now.

## 2020-07-29 NOTE — Progress Notes (Signed)
    SUBJECTIVE:   CHIEF COMPLAINT / HPI:   FU hypertension.  Patient has  ha been previously well controled BP.  He had oral surgery to remove 9 teeth, took lots of ibuprofen for pain control and was noted to have elevated BP.  Told by dentist to stop ibuprofen and FU with [place].  He has done that   Also due for flu shot, COVID booster and pneumonia vaccine.  He flat out refused the flu shot.  Michela Pitcher he is not interested in any immunizations currently.  States that he may accept "in a couple of months."    OBJECTIVE:   BP 134/80   Pulse 65   Ht 6\' 3"  (1.905 m)   Wt 200 lb 12.8 oz (91.1 kg)   SpO2 95%   BMI 25.10 kg/m   Normal BP noted Lungs clear Cardiac RRR without m or g  ASSESSMENT/PLAN:   Elevated blood pressure reading in office without diagnosis of hypertension Transient elevation of BP was either due to pain from oral surg or from the ibuprofen.  No change for now.     Zenia Resides, MD [place]

## 2020-08-02 NOTE — Telephone Encounter (Signed)
Vaccines documented. Ottis Stain, CMA

## 2020-08-05 NOTE — Progress Notes (Signed)
    SUBJECTIVE:   CHIEF COMPLAINT / HPI:   Hypertension: Patient is a 64 y.o. male who present today for follow up of hypertension. Patient states he went to an appointment last week and his blood pressure was fine but went to a fire station and had his BP checked after "eating a lot of pork" and told him his his pressure was "200/190". He said he had no symptoms at the time, no dizziness, blurry vision, headache, and simply wanted to go check his blood pressure. The person who checked it at the fire station said that his pressure was not a concern but he should follow up with his primary doctor. The patient endorses difficulty hearing and there is possibility that he misheard the number, especially since the fire department didn't recommend him go immediately to the emergency department. Patient endorses no ibuprofen use since his last surgery.   PERTINENT  PMH / PSH: None relevant  OBJECTIVE:   BP 132/70   Pulse 62   Ht 6\' 3"  (1.905 m)   Wt 200 lb (90.7 kg)   SpO2 99%   BMI 25.00 kg/m    General: NAD, pleasant, able to participate in exam Cardiac: RRR, no murmurs. Respiratory: CTAB, normal effort Extremities: no edema or cyanosis. Psych: Normal affect and mood  ASSESSMENT/PLAN:   Elevated blood pressure reading in office without diagnosis of hypertension Assessment: Blood pressure 132/70 today, was similar at previous appointment.  The only reason the patient follows up today as he states that the only reason he came in today as he went to the fire department after eating "a lot of pork" and wanted to have his blood pressure checked and was told that he had a blood pressure of "200/190".  The patient denied any symptoms at that time.  The patient has a history of difficulty hearing and the fact that the fire department told him that this number was nothing to worry about and he should just make a follow-up appointment as needed and the fact that the number is 200/190 suggest that the  patient simply missed heard the number.  During previous office checks as patient has had blood pressures within normal limits. Plan: -Reassured the patient that his blood pressures have been appropriate at recent visits -Recommended that if patient becomes concerned about his blood pressures and wants to monitor them more regularly that an automated blood pressure cuff would not be a bad idea, but with his previous office visits I do not have concern for elevated blood pressure at this time -Recommend patient follow-up as needed, patient plans to make a follow-up appointment with his PCP in January for normal health visit.     Lurline Del, [place]    This note was prepared using Dragon voice recognition software and may include unintentional dictation errors due to the inherent limitations of voice recognition software.

## 2020-08-06 ENCOUNTER — Ambulatory Visit (INDEPENDENT_AMBULATORY_CARE_PROVIDER_SITE_OTHER): Payer: Medicare Other | Admitting: Family Medicine

## 2020-08-06 ENCOUNTER — Other Ambulatory Visit: Payer: Self-pay

## 2020-08-06 DIAGNOSIS — R030 Elevated blood-pressure reading, without diagnosis of hypertension: Secondary | ICD-10-CM | POA: Diagnosis not present

## 2020-08-06 NOTE — Assessment & Plan Note (Signed)
Assessment: Blood pressure 132/70 today, was similar at previous appointment.  The only reason the patient follows up today as he states that the only reason he came in today as he went to the fire department after eating "a lot of pork" and wanted to have his blood pressure checked and was told that he had a blood pressure of "200/190".  The patient denied any symptoms at that time.  The patient has a history of difficulty hearing and the fact that the fire department told him that this number was nothing to worry about and he should just make a follow-up appointment as needed and the fact that the number is 200/190 suggest that the patient simply missed heard the number.  During previous office checks as patient has had blood pressures within normal limits. Plan: -Reassured the patient that his blood pressures have been appropriate at recent visits -Recommended that if patient becomes concerned about his blood pressures and wants to monitor them more regularly that an automated blood pressure cuff would not be a bad idea, but with his previous office visits I do not have concern for elevated blood pressure at this time -Recommend patient follow-up as needed, patient plans to make a follow-up appointment with his PCP in January for normal health visit.

## 2020-08-06 NOTE — Patient Instructions (Signed)
It was great to see you!  Our plans for today:  - Your blood pressure looks good in our appointment today. - If you get really concerned about the blood pressure you could consider getting an automatic blood pressure cuff. - Follow up as needed  Take care and seek immediate care sooner if you develop any concerns.   Dr. [facility]

## 2020-08-08 NOTE — Addendum Note (Signed)
Addended by: Lurline Del on: 08/08/2020 07:45 AM   Modules accepted: Miquel Dunn

## 2020-08-11 NOTE — Addendum Note (Signed)
Addended by: Lurline Del on: 08/11/2020 11:15 AM   Modules accepted: Miquel Dunn

## 2020-08-12 ENCOUNTER — Encounter: Payer: Self-pay | Admitting: Family Medicine

## 2020-08-12 ENCOUNTER — Ambulatory Visit (INDEPENDENT_AMBULATORY_CARE_PROVIDER_SITE_OTHER): Payer: Medicare Other | Admitting: Family Medicine

## 2020-08-12 ENCOUNTER — Other Ambulatory Visit: Payer: Self-pay

## 2020-08-12 DIAGNOSIS — G8929 Other chronic pain: Secondary | ICD-10-CM

## 2020-08-12 DIAGNOSIS — M545 Low back pain, unspecified: Secondary | ICD-10-CM | POA: Diagnosis not present

## 2020-08-12 MED ORDER — BACLOFEN 10 MG PO TABS: 10.0000 mg | ORAL_TABLET | Freq: Three times a day (TID) | ORAL | 1 refills | Status: DC | PRN

## 2020-08-12 MED ORDER — DICLOFENAC SODIUM 1 % EX GEL: CUTANEOUS | 0 refills | Status: DC

## 2020-08-12 NOTE — Progress Notes (Signed)
    SUBJECTIVE:   CHIEF COMPLAINT / HPI:   5 days of acute, bilateral low back pain.  Hurt it while lifting.  No radiation to legs.  No bowel or bladder sx.  No fever and no skin or resp sx suggesting infection.  No personal hx of cancer.  He thinks he had back pain once before.  Recalls that a muscle relaxer helped before.  I found LS spine series for 09/2018 showing facet arthropathy and loss of disc height.  Had previous BP elevation with ibuprofen.     OBJECTIVE:   BP 108/64   Pulse 75   Ht 6\' 3"  (1.905 m)   Wt 200 lb 9.6 oz (91 kg)   SpO2 98%   BMI 25.07 kg/m   Bilateral paraspinous muscle tenderness.  Loss of lumbar lordosis and limited range of motion.   Normal strength, sensation and reflexes in both lower extremities.  ASSESSMENT/PLAN:   Acute exacerbation of chronic low back pain Muscle relaxer and topical votaren gel.       Zenia Resides, MD [place]

## 2020-08-12 NOTE — Assessment & Plan Note (Signed)
Muscle relaxer and topical votaren gel.

## 2020-08-12 NOTE — Patient Instructions (Signed)
I sent in a prescription for a muscle relaxer.  You have arthritis and disc disease in your back.  It will flair up from time to time. OK to use the muscle relaxer and tylenol together.  Stay away from ibuprofen since it sent your blood pressure.   I also sent in a prescription for a muscle rub to use on your back.

## 2020-09-01 ENCOUNTER — Other Ambulatory Visit: Payer: Self-pay | Admitting: Family Medicine

## 2020-09-01 DIAGNOSIS — E119 Type 2 diabetes mellitus without complications: Secondary | ICD-10-CM

## 2020-09-03 ENCOUNTER — Other Ambulatory Visit: Payer: Self-pay | Admitting: Family Medicine

## 2020-09-20 ENCOUNTER — Other Ambulatory Visit: Payer: Self-pay

## 2020-09-20 ENCOUNTER — Ambulatory Visit (INDEPENDENT_AMBULATORY_CARE_PROVIDER_SITE_OTHER): Payer: Medicare Other | Admitting: Family Medicine

## 2020-09-20 VITALS — BP 130/60 | HR 60 | Ht 74.0 in | Wt 199.0 lb

## 2020-09-20 DIAGNOSIS — G8929 Other chronic pain: Secondary | ICD-10-CM | POA: Diagnosis not present

## 2020-09-20 DIAGNOSIS — M545 Low back pain, unspecified: Secondary | ICD-10-CM | POA: Diagnosis not present

## 2020-09-20 DIAGNOSIS — E119 Type 2 diabetes mellitus without complications: Secondary | ICD-10-CM | POA: Diagnosis not present

## 2020-09-20 LAB — POCT GLYCOSYLATED HEMOGLOBIN (HGB A1C): Hemoglobin A1C: 6.3 % — AB (ref 4.0–5.6)

## 2020-09-20 MED ORDER — BACLOFEN 10 MG PO TABS: 10.0000 mg | ORAL_TABLET | Freq: Three times a day (TID) | ORAL | 1 refills | Status: DC | PRN

## 2020-09-20 NOTE — Patient Instructions (Signed)
Thank you for coming to see me today. It was a pleasure. Today we talked about:   You should pay attention to your hemoglobin A1C.  It is a three month test about your average blood sugar. If the A1C is - <7.0 is great.  That is our goal for treating you. - Between 7.0 and 9.0 is not so good.  We would need to work to do better. - Above 9.0 is terrible.  You would really need to work with [place] to get it under control.    Today's A1C = 6.3  You are due for a colonoscopy.  Please use the form that we have given you to schedule this at your convenience.   We will repeat blood work at your next visit.  Please follow-up with PCP in 6 months  If you have any questions or concerns, please do not hesitate to call the office at [phone].  Best,   Carollee Leitz, MD

## 2020-09-20 NOTE — Progress Notes (Signed)
    SUBJECTIVE:   CHIEF COMPLAINT / HPI: 3 month follow up  Patient has no acute concerns today.  Does not check blood pressure at home.  Reports blood glucose at home between 106 and 203.  Denies any hypoglycemic events, polyuria or polydipsia.  Denies any chest pain, shortness of breath or abdominal pain.  Compliant with medications.   Patient is requesting refill for baclofen for intermittent muscle spasms.  Is a history of chronic low back pain.  Last refill of baclofen back in November 2021.  He is not currently having any muscle spasms or issues today.  PERTINENT  PMH / PSH:  Diabetes mellitus type 2  OBJECTIVE:   BP 130/60   Pulse 60   Ht 6\' 2"  (1.88 m)   Wt 199 lb (90.3 kg)   BMI 25.55 kg/m    General: Alert, no acute distress Cardio: Normal S1 and S2, RRR, no r/m/g Pulm: CTAB, normal work of breathing Abdomen: Bowel sounds normal. Abdomen soft and non-tender.  Extremities: No peripheral edema.  Neuro: Cranial nerves grossly intact   ASSESSMENT/PLAN:   Type 2 diabetes mellitus without complication (HCC) Good blood glucose control.  A1c today 6.3, decreased from 6.8 at last visit.  Blood pressure 130/60 today however on chart review appears his blood pressure has been on the lower side 110-120s.  We will continue to monitor and if blood pressure continues to remain 130s can consider adding low-dose losartan. Continue Metformin 500 mg twice daily Follow-up in 6/12   Acute exacerbation of chronic low back pain Exam normal today. Refill baclofen 10 mg 3 times daily for acute exacerbations Follow-up with PCP as needed.     Carollee Leitz, MD [place]

## 2020-09-22 ENCOUNTER — Encounter: Payer: Self-pay | Admitting: Family Medicine

## 2020-09-22 NOTE — Assessment & Plan Note (Signed)
Exam normal today. Refill baclofen 10 mg 3 times daily for acute exacerbations Follow-up with PCP as needed.

## 2020-09-22 NOTE — Assessment & Plan Note (Signed)
Good blood glucose control.  A1c today 6.3, decreased from 6.8 at last visit.  Blood pressure 130/60 today however on chart review appears his blood pressure has been on the lower side 110-120s.  We will continue to monitor and if blood pressure continues to remain 130s can consider adding low-dose losartan. Continue Metformin 500 mg twice daily Follow-up in 6/12

## 2020-10-01 DIAGNOSIS — H524 Presbyopia: Secondary | ICD-10-CM | POA: Diagnosis not present

## 2020-10-01 DIAGNOSIS — H5203 Hypermetropia, bilateral: Secondary | ICD-10-CM | POA: Diagnosis not present

## 2020-10-01 LAB — HM DIABETES EYE EXAM

## 2020-10-07 ENCOUNTER — Other Ambulatory Visit: Payer: Self-pay | Admitting: Family Medicine

## 2020-10-21 ENCOUNTER — Other Ambulatory Visit: Payer: Self-pay | Admitting: Family Medicine

## 2020-10-21 DIAGNOSIS — E119 Type 2 diabetes mellitus without complications: Secondary | ICD-10-CM

## 2020-12-10 ENCOUNTER — Other Ambulatory Visit: Payer: Self-pay | Admitting: Family Medicine

## 2020-12-10 DIAGNOSIS — E119 Type 2 diabetes mellitus without complications: Secondary | ICD-10-CM

## 2020-12-19 ENCOUNTER — Other Ambulatory Visit: Payer: Self-pay | Admitting: Family Medicine

## 2020-12-19 DIAGNOSIS — E119 Type 2 diabetes mellitus without complications: Secondary | ICD-10-CM

## 2020-12-26 ENCOUNTER — Other Ambulatory Visit: Payer: Self-pay | Admitting: Family Medicine

## 2020-12-26 DIAGNOSIS — G8929 Other chronic pain: Secondary | ICD-10-CM

## 2020-12-26 DIAGNOSIS — M545 Low back pain, unspecified: Secondary | ICD-10-CM

## 2021-01-20 ENCOUNTER — Other Ambulatory Visit: Payer: Self-pay

## 2021-01-20 ENCOUNTER — Encounter: Payer: Self-pay | Admitting: Family Medicine

## 2021-01-20 ENCOUNTER — Ambulatory Visit (INDEPENDENT_AMBULATORY_CARE_PROVIDER_SITE_OTHER): Payer: Medicare Other | Admitting: Family Medicine

## 2021-01-20 VITALS — BP 118/70 | HR 72 | Ht 74.0 in | Wt 185.6 lb

## 2021-01-20 DIAGNOSIS — E785 Hyperlipidemia, unspecified: Secondary | ICD-10-CM

## 2021-01-20 DIAGNOSIS — E119 Type 2 diabetes mellitus without complications: Secondary | ICD-10-CM | POA: Diagnosis not present

## 2021-01-20 DIAGNOSIS — R634 Abnormal weight loss: Secondary | ICD-10-CM

## 2021-01-20 DIAGNOSIS — Z1211 Encounter for screening for malignant neoplasm of colon: Secondary | ICD-10-CM | POA: Diagnosis not present

## 2021-01-20 DIAGNOSIS — Z122 Encounter for screening for malignant neoplasm of respiratory organs: Secondary | ICD-10-CM

## 2021-01-20 DIAGNOSIS — G8929 Other chronic pain: Secondary | ICD-10-CM | POA: Diagnosis not present

## 2021-01-20 DIAGNOSIS — M545 Low back pain, unspecified: Secondary | ICD-10-CM | POA: Diagnosis not present

## 2021-01-20 HISTORY — DX: Abnormal weight loss: R63.4

## 2021-01-20 LAB — POCT GLYCOSYLATED HEMOGLOBIN (HGB A1C): HbA1c, POC (controlled diabetic range): 6.3 % (ref 0.0–7.0)

## 2021-01-20 MED ORDER — DICLOFENAC SODIUM 1 % EX GEL: CUTANEOUS | 0 refills | Status: DC

## 2021-01-20 MED ORDER — METFORMIN HCL 500 MG PO TABS: 1.0000 | ORAL_TABLET | Freq: Two times a day (BID) | ORAL | 0 refills | Status: DC

## 2021-01-20 NOTE — Assessment & Plan Note (Signed)
Well controlled.  Last LDL 53 -Repeat Lipid panel today -Continue Atorvastatin 20 mg daily -Follow 6/12

## 2021-01-20 NOTE — Progress Notes (Addendum)
    SUBJECTIVE:   CHIEF COMPLAINT / HPI: diabetes check up  Diabetes follow up Does not check CBG at home.  Denies any shortness of breath, chest pain, abdominal pain, polyuria or polydipsia.  Reports feels good.    Endorses weight loss 14lbs in 4 months.  Denies any decrease in appetite and attributes his lack of food intake to being busy with his 45 month old.  States that he has a good appetite but only eats twice a day.  Mostly fast food. Denies any nausea, vomiting, abdominal pain, hematuria, diarrhea, constipation or bloody stool.  Endorses cough when having a cigarette but no bloody sputum.      PERTINENT  PMH / PSH: DM Type 2 HLD Tobacco dependence  OBJECTIVE:   BP 118/70   Pulse 72   Ht 6\' 2"  (1.88 m)   Wt 185 lb 9.6 oz (84.2 kg)   SpO2 96%   BMI 23.83 kg/m    General: Alert, no acute distress, hard of hearing  Neck: supple, no thyromegaly, no cervical or supraclavicular lymphadenopathy appreciated Cardio: Normal S1 and S2, RRR, no r/m/g Pulm: CTAB, normal work of breathing Abdomen: Bowel sounds normal. Abdomen soft and non-tender.  Extremities: No peripheral edema.  Diabetic foot exam: No deformities, ulcerations, or other skin breakdown on feet bilaterally.  Sensation intact to monofilament and light touch.  PT and DP pulses intact BL.   ASSESSMENT/PLAN:   Type 2 diabetes mellitus without complication (HCC) T0Z unchanged from previous visit.  Asymptomatic. BP at goal <120/80 -Continue Metformin 500 mg BID -Continue to monitor blood pressure.  -Cmet today -Follow up 6/12  Hyperlipidemia Well controlled.  Last LDL 53 -Repeat Lipid panel today -Continue Atorvastatin 20 mg daily -Follow 6/12   Weight loss, unintentional 14 lbs weight loss in 4 months.  Denies any decrease in appetite, nausea/vomiting, bloody stool. Endorses cough with tobacco use but no hemoptysis.  Colonoscopy previously ordered 10/21 however not completed.  Patient is a 30yppd tobacco use.   Exam normal. Noted in chart that he has been at this current weight previously.  Differential includes malignancy, nonmalignant GI disease, psychosocial factors, medications.  -Refer for colonoscopy -Low dose CT for lung cancer screening scheduled for May 20 at 3 pm -CBC and Cmet today -Consider further workup if continues to loose weight. -Follow up with results -Follow up 3-6 months or sooner if worsens    Health maintenance  Covid vaccine: reports has has three shots Flu vaccine: declined PNA: declined Shingles: Encouraged to receive at local pharmacy Colonoscopy: Re referred today CT chest Lung Cancer screening: scheduled for May 20/22  Carollee Leitz, MD [place]

## 2021-01-20 NOTE — Assessment & Plan Note (Signed)
14 lbs weight loss in 4 months.  Denies any decrease in appetite, nausea/vomiting, bloody stool. Endorses cough with tobacco use but no hemoptysis.  Colonoscopy previously ordered 10/21 however not completed.  Patient is a 30yppd tobacco use.  Exam normal. Noted in chart that he has been at this current weight previously.  Differential includes malignancy, nonmalignant GI disease, psychosocial factors, medications.  -Refer for colonoscopy -Low dose CT for lung cancer screening scheduled for May 20 at 3 pm -CBC and Cmet today -Consider further workup if continues to loose weight. -Follow up with results -Follow up 3-6 months or sooner if worsens

## 2021-01-20 NOTE — Assessment & Plan Note (Addendum)
A1c unchanged from previous visit.  Asymptomatic. BP at goal <120/80 -Continue Metformin 500 mg BID -Continue to monitor blood pressure.  -Cmet today -Follow up 6/12

## 2021-01-20 NOTE — Patient Instructions (Addendum)
Thank you for coming to see me today. It was a pleasure. T  You should pay attention to your hemoglobin A1C.  It is a three month test about your average blood sugar. If the A1C is - <7.0 is great.  That is our goal for treating you. - Between 7.0 and 9.0 is not so good.  We would need to work to do better. - Above 9.0 is terrible.  You would really need to work with [place] to get it under control.    Today's A1C = 6.3  We will get some labs today.  If they are abnormal or we need to do something about them, I will call you.  If they are normal, I will send you a message on MyChart (if it is active) or a letter in the mail.  If you don't hear from [place] in 2 weeks, please call the office at the number below.   You are due for a colonoscopy.  Please use the form that we have given you to schedule this at your convenience.    I have placed an order for CT imaging of your lungs .  Please go to [place] at [employer] to have this completed.  You have an appointment on Friday May 20 at 3 pm. If you cannot make it you need to call 24 hours in advance. The number is [phone]. We will contact you with your results afterwards.  You can check with your pharmacy to have the Shingles vaccine administered.  Please follow-up with PCP in 6 months  If you have any questions or concerns, please do not hesitate to call the office at [phone].  Best,   Carollee Leitz, MD    Recombinant Zoster (Shingles) Vaccine: What You Need to Know 1. Why get vaccinated? Recombinant zoster (shingles) vaccine can prevent shingles. Shingles (also called herpes zoster, or just zoster) is a painful skin rash, usually with blisters. In addition to the rash, shingles can cause fever, headache, chills, or upset stomach. More rarely, shingles can lead to pneumonia, hearing problems, blindness, brain inflammation (encephalitis), or death. The most common complication of shingles is long-term nerve pain called  postherpetic neuralgia (PHN). PHN occurs in the areas where the shingles rash was, even after the rash clears up. It can last for months or years after the rash goes away. The pain from PHN can be severe and debilitating. About 10 to 18% of people who get shingles will experience PHN. The risk of PHN increases with age. An older adult with shingles is more likely to develop PHN and have longer lasting and more severe pain than a younger person with shingles. Shingles is caused by the varicella zoster virus, the same virus that causes chickenpox. After you have chickenpox, the virus stays in your body and can cause shingles later in life. Shingles cannot be passed from one person to another, but the virus that causes shingles can spread and cause chickenpox in someone who had never had chickenpox or received chickenpox vaccine. 2. Recombinant shingles vaccine Recombinant shingles vaccine provides strong protection against shingles. By preventing shingles, recombinant shingles vaccine also protects against PHN. Recombinant shingles vaccine is the preferred vaccine for the prevention of shingles. However, a different vaccine, live shingles vaccine, may be used in some circumstances. The recombinant shingles vaccine is recommended for adults 50 years and older without serious immune problems. It is given as a two-dose series. This vaccine is also recommended for people who  have already gotten another type of shingles vaccine, the live shingles vaccine. There is no live virus in this vaccine. Shingles vaccine may be given at the same time as other vaccines. 3. Talk with your health care provider Tell your vaccine provider if the person getting the vaccine:  Has had an allergic reaction after a previous dose of recombinant shingles vaccine, or has any severe, life-threatening allergies.  Is pregnant or breastfeeding.  Is currently experiencing an episode of shingles. In some cases, your health care  provider may decide to postpone shingles vaccination to a future visit. People with minor illnesses, such as a cold, may be vaccinated. People who are moderately or severely ill should usually wait until they recover before getting recombinant shingles vaccine. Your health care provider can give you more information. 4. Risks of a vaccine reaction  A sore arm with mild or moderate pain is very common after recombinant shingles vaccine, affecting about 80% of vaccinated people. Redness and swelling can also happen at the site of the injection.  Tiredness, muscle pain, headache, shivering, fever, stomach pain, and nausea happen after vaccination in more than half of people who receive recombinant shingles vaccine. In clinical trials, about 1 out of 6 people who got recombinant zoster vaccine experienced side effects that prevented them from doing regular activities. Symptoms usually went away on their own in 2 to 3 days. You should still get the second dose of recombinant zoster vaccine even if you had one of these reactions after the first dose. People sometimes faint after medical procedures, including vaccination. Tell your provider if you feel dizzy or have vision changes or ringing in the ears. As with any medicine, there is a very remote chance of a vaccine causing a severe allergic reaction, other serious injury, or death. 5. What if there is a serious problem? An allergic reaction could occur after the vaccinated person leaves the clinic. If you see signs of a severe allergic reaction (hives, swelling of the face and throat, difficulty breathing, a fast heartbeat, dizziness, or weakness), call 9-1-1 and get the person to the nearest hospital. For other signs that concern you, call your health care provider. Adverse reactions should be reported to the Vaccine Adverse Event Reporting System (VAERS). Your health care provider will usually file this report, or you can do it yourself. Visit the VAERS  website at www.vaers.SamedayNews.es or call 984-615-4825. VAERS is only for reporting reactions, and VAERS staff do not give medical advice. 6. How can I learn more?  Ask your health care provider.  Call your local or state health department.  Contact the Centers for Disease Control and Prevention (CDC): ? Call 380-254-6473 (1-800-CDC-INFO) or ? Visit CDC's website at http://hunter.com/ Vaccine Information Statement Recombinant Zoster Vaccine (07/13/2018) This information is not intended to replace advice given to you by your health care provider. Make sure you discuss any questions you have with your health care provider. Document Revised: 05/03/2020 Document Reviewed: 05/03/2020 Elsevier Patient Education  [place].

## 2021-01-21 ENCOUNTER — Encounter: Payer: Self-pay | Admitting: Physician Assistant

## 2021-01-21 LAB — COMPREHENSIVE METABOLIC PANEL
ALT: 7 IU/L (ref 0–44)
AST: 17 IU/L (ref 0–40)
Albumin/Globulin Ratio: 2 (ref 1.2–2.2)
Albumin: 4.3 g/dL (ref 3.8–4.8)
Alkaline Phosphatase: 74 IU/L (ref 44–121)
BUN/Creatinine Ratio: 10 (ref 10–24)
BUN: 10 mg/dL (ref 8–27)
Bilirubin Total: 0.5 mg/dL (ref 0.0–1.2)
CO2: 25 mmol/L (ref 20–29)
Calcium: 10.1 mg/dL (ref 8.6–10.2)
Chloride: 102 mmol/L (ref 96–106)
Creatinine, Ser: 1.01 mg/dL (ref 0.76–1.27)
Globulin, Total: 2.1 g/dL (ref 1.5–4.5)
Glucose: 109 mg/dL — ABNORMAL HIGH (ref 65–99)
Potassium: 4.2 mmol/L (ref 3.5–5.2)
Sodium: 140 mmol/L (ref 134–144)
Total Protein: 6.4 g/dL (ref 6.0–8.5)
eGFR: 82 mL/min/{1.73_m2} (ref >59)

## 2021-01-21 LAB — LIPID PANEL
Chol/HDL Ratio: 2.1 ratio (ref 0.0–5.0)
Cholesterol, Total: 90 mg/dL — ABNORMAL LOW (ref 100–199)
HDL: 42 mg/dL (ref >39)
LDL Chol Calc (NIH): 34 mg/dL (ref 0–99)
Triglycerides: 63 mg/dL (ref 0–149)
VLDL Cholesterol Cal: 14 mg/dL (ref 5–40)

## 2021-01-21 LAB — CBC WITH DIFFERENTIAL/PLATELET
Basophils Absolute: 0.1 10*3/uL (ref 0.0–0.2)
Basos: 1 %
EOS (ABSOLUTE): 0.1 10*3/uL (ref 0.0–0.4)
Eos: 2 %
Hematocrit: 43.3 % (ref 37.5–51.0)
Hemoglobin: 14.1 g/dL (ref 13.0–17.7)
Immature Grans (Abs): 0 10*3/uL (ref 0.0–0.1)
Immature Granulocytes: 0 %
Lymphocytes Absolute: 2.7 10*3/uL (ref 0.7–3.1)
Lymphs: 37 %
MCH: 27.5 pg (ref 26.6–33.0)
MCHC: 32.6 g/dL (ref 31.5–35.7)
MCV: 85 fL (ref 79–97)
Monocytes Absolute: 0.4 10*3/uL (ref 0.1–0.9)
Monocytes: 5 %
Neutrophils Absolute: 4 10*3/uL (ref 1.4–7.0)
Neutrophils: 55 %
Platelets: 212 10*3/uL (ref 150–450)
RBC: 5.12 x10E6/uL (ref 4.14–5.80)
RDW: 13.1 % (ref 11.6–15.4)
WBC: 7.2 10*3/uL (ref 3.4–10.8)

## 2021-01-22 ENCOUNTER — Encounter: Payer: Self-pay | Admitting: Family Medicine

## 2021-01-22 NOTE — Progress Notes (Signed)
Letter to be sent to patient.  Lab normal.  Will discuss at next visit.   Carollee Leitz, MD [facility]

## 2021-01-31 ENCOUNTER — Ambulatory Visit
Admission: RE | Admit: 2021-01-31 | Discharge: 2021-01-31 | Disposition: A | Discharge status: Home / Self Care | Payer: Medicare Other | Source: Ambulatory Visit | Attending: Family Medicine | Admitting: Family Medicine

## 2021-01-31 ENCOUNTER — Other Ambulatory Visit: Payer: Self-pay

## 2021-01-31 DIAGNOSIS — Z122 Encounter for screening for malignant neoplasm of respiratory organs: Secondary | ICD-10-CM

## 2021-01-31 DIAGNOSIS — F17210 Nicotine dependence, cigarettes, uncomplicated: Secondary | ICD-10-CM | POA: Diagnosis not present

## 2021-02-06 ENCOUNTER — Other Ambulatory Visit: Payer: Self-pay | Admitting: Family Medicine

## 2021-02-06 DIAGNOSIS — E119 Type 2 diabetes mellitus without complications: Secondary | ICD-10-CM

## 2021-02-13 ENCOUNTER — Ambulatory Visit (INDEPENDENT_AMBULATORY_CARE_PROVIDER_SITE_OTHER): Payer: Medicare Other | Admitting: Physician Assistant

## 2021-02-13 ENCOUNTER — Encounter: Payer: Self-pay | Admitting: Physician Assistant

## 2021-02-13 VITALS — BP 128/80 | HR 119 | Ht 74.0 in | Wt 184.0 lb

## 2021-02-13 DIAGNOSIS — Z1211 Encounter for screening for malignant neoplasm of colon: Secondary | ICD-10-CM

## 2021-02-13 DIAGNOSIS — R634 Abnormal weight loss: Secondary | ICD-10-CM

## 2021-02-13 MED ORDER — NA SULFATE-K SULFATE-MG SULF 17.5-3.13-1.6 GM/177ML PO SOLN: 1.0000 | Freq: Once | ORAL | 0 refills | Status: AC

## 2021-02-13 NOTE — Patient Instructions (Signed)
If you are age 66 or older, your body mass index should be between 23-30. Your Body mass index is 23.62 kg/m. If this is out of the aforementioned range listed, please consider follow up with your Primary Care Provider. __________________________________________________________  The [place] [facility] providers would like to encourage you to use [facility] to communicate with providers for non-urgent requests or questions.  Due to long hold times on the telephone, sending your provider a message by [facility] may be a faster and more efficient way to get a response.  Please allow 48 business hours for a response.  Please remember that this is for non-urgent requests.   You have been scheduled for a colonoscopy. Please follow written instructions given to you at your visit today.  Please pick up your prep supplies at the pharmacy within the next 1-3 days. If you use inhalers (even only as needed), please bring them with you on the day of your procedure.  Follow up as needed for now.  Thank you for entrusting me with your care and choosing [facility].  Amy Esterwood, PA-C

## 2021-02-13 NOTE — Progress Notes (Signed)
Subjective:    Patient ID: [name], male    DOB: 1956/05/31, 65 y.o.   MRN: [id number]  HPI [name] is a 65 year old male, new to GI today referred by Pollie Friar, MD/[facility] for colonoscopy.  Patient says he has not had any prior GI evaluation. He does have history of adult onset diabetes mellitus, arthritis and hyperlipidemia. He had been in for recent checkup with primary care and had related a 14 pound weight loss in the past 4 to 5 months. Patient denies any heartburn or indigestion, no dysphagia or odynophagia.  No complaints of abdominal pain, no changes in bowel habits, no melena or hematochezia.  He says that his appetite has been fine but his living situation has changed and he no longer has anyone cooking for him so he has been eating less.  He says he used to always eat 3 meals per day and is now eating just 2 meals per day and sometimes will just have a bowl of cereal or something similar and easy for dinner. He thinks he has lost weight because of this. Recent labs showed hemoglobin 14.1/hematocrit 43.3, c-Met unremarkable Chest CT 02/03/2021 negative  Family history negative for colon cancer and polyps as far as he is aware.  Review of Systems Pertinent positive and negative review of systems were noted in the above HPI section.  All other review of systems was otherwise negative.  Outpatient Encounter Medications as of 02/13/2021  Medication Sig  . atorvastatin (LIPITOR) 20 MG tablet Take 1 tablet by mouth daily  . diclofenac Sodium (VOLTAREN) 1 % GEL APPLY 2 GRAMS TOPICALLY 4 TIMES DAILY  . glucose blood (IGLUCOSE TEST STRIPS) test strip Use as instructed  . lactulose (CONSTULOSE) 10 GM/15ML solution TAKE 15 MLS BY MOUTH DAILY AS NEEDED FOR MILD CONSTIPATION.  . metFORMIN (GLUCOPHAGE) 500 MG tablet Take 1 tablet (500 mg total) by mouth 2 (two) times daily with a meal.  . Na Sulfate-K Sulfate-Mg Sulf 17.5-3.13-1.6 GM/177ML SOLN Take 1 kit by mouth once for 1  dose.  . [DISCONTINUED] baclofen (LIORESAL) 10 MG tablet Take 1 tablet (10 mg total) by mouth 3 (three) times daily as needed for muscle spasms. (Patient not taking: Reported on 02/13/2021)   No facility-administered encounter medications on file as of 02/13/2021.   No Known Allergies Patient Active Problem List   Diagnosis Date Noted  . Weight loss, unintentional 01/20/2021  . Acute exacerbation of chronic low back pain 08/12/2020  . Elevated blood pressure reading in office without diagnosis of hypertension 07/29/2020  . Hav (hallux abducto valgus), unspecified laterality 07/23/2020  . Health care maintenance 04/10/2020  . Hypercalcemia 01/24/2020  . Tobacco dependence 01/23/2020  . Conductive hearing loss, bilateral 10/23/2019  . Left shoulder pain 03/06/2019  . Chronic right shoulder pain 02/17/2019  . Cough 08/31/2018  . Arthritis 08/31/2018  . Hyperlipidemia 06/07/2018  . Type 2 diabetes mellitus without complication ([place]) 40/98/1191  . Constipation 06/07/2018   Social History   Socioeconomic History  . Marital status: Married    Spouse name: Not on file  . Number of children: Not on file  . Years of education: Not on file  . Highest education level: Not on file  Occupational History  . Occupation: retired  Tobacco Use  . Smoking status: Current Every Day Smoker    Packs/day: 0.50    Years: 25.00    Pack years: 12.50    Types: Cigarettes  . Smokeless tobacco: Current User  Substance  and Sexual Activity  . Alcohol use: Never  . Drug use: Never  . Sexual activity: Not Currently  Other Topics Concern  . Not on file  Social History Narrative  . Not on file   Social Determinants of Health   Financial Resource Strain: Not on file  Food Insecurity: Not on file  Transportation Needs: Not on file  Physical Activity: Not on file  Stress: Not on file  Social Connections: Not on file  Intimate Partner Violence: Not on file    [name] family history includes  Diabetes in his brother and mother.      Objective:    Vitals:   02/13/21 1527  BP: 128/80  Pulse: (!) 119  SpO2: 98%    Physical Exam Well-developed well-nourished older African-American male in no acute distress.  Height, Weight, 184 BMI 23.6  HEENT; nontraumatic normocephalic, EOMI, PE R LA, sclera anicteric. Oropharynx; not examined today Neck; supple, no JVD Cardiovascular; regular rate and rhythm with S1-S2, no murmur rub or gallop Pulmonary; Clear bilaterally Abdomen; soft, nontender, nondistended, no palpable mass or hepatosplenomegaly, bowel sounds are active Rectal; not done Skin; benign exam, no jaundice rash or appreciable lesions Extremities; no clubbing cyanosis or edema skin warm and dry Neuro/Psych; alert and oriented x4, grossly nonfocal mood and affect appropriate       Assessment & Plan:   #51 65 year old African-American male referred for colon cancer screening, no prior colonoscopy and currently asymptomatic and average risk  #2 recent weight loss-reassuring labs and chest CT.  Patient relates that he has had a change in his diet due to change in living situation and has been eating 2 meals per day rather than 3 because of this. I suspect his weight loss is on the basis of change in diet  #3 adult onset diabetes mellitus 4.  Osteoarthritis 5.  Hyperlipidemia  Plan; patient will be scheduled for colonoscopy with Dr. Hilarie Fredrickson.  Procedure was discussed in detail with the patient including indications risk and benefits and he is agreeable to proceed. Patient encouraged to eat 3 meals per day and work towards eating a more balanced diet which she is agreeable to. If colonoscopy is negative and he continues to lose weight despite working on improving his diet than abdominal imaging may be indicated  [name] S Homero Hyson PA-C 02/13/2021   Cc: Carollee Leitz, MD

## 2021-02-13 NOTE — Progress Notes (Signed)
Addendum: Reviewed and agree with assessment and management plan. Holland Nickson M, MD  

## 2021-02-24 ENCOUNTER — Encounter: Payer: Self-pay | Admitting: Internal Medicine

## 2021-02-24 ENCOUNTER — Other Ambulatory Visit: Payer: Self-pay

## 2021-02-24 ENCOUNTER — Ambulatory Visit (AMBULATORY_SURGERY_CENTER): Payer: Medicare Other | Admitting: Internal Medicine

## 2021-02-24 VITALS — BP 132/77 | HR 60 | Temp 98.1°F | Resp 14 | Ht 74.0 in | Wt 184.0 lb

## 2021-02-24 DIAGNOSIS — D123 Benign neoplasm of transverse colon: Secondary | ICD-10-CM | POA: Diagnosis not present

## 2021-02-24 DIAGNOSIS — D122 Benign neoplasm of ascending colon: Secondary | ICD-10-CM

## 2021-02-24 DIAGNOSIS — Z1211 Encounter for screening for malignant neoplasm of colon: Secondary | ICD-10-CM

## 2021-02-24 DIAGNOSIS — D120 Benign neoplasm of cecum: Secondary | ICD-10-CM | POA: Diagnosis not present

## 2021-02-24 DIAGNOSIS — R634 Abnormal weight loss: Secondary | ICD-10-CM

## 2021-02-24 MED ORDER — SODIUM CHLORIDE 0.9 % IV SOLN: 500.0000 mL | Freq: Once | INTRAVENOUS | Status: DC

## 2021-02-24 NOTE — Progress Notes (Signed)
Pt's states no medical or surgical changes since previsit or office visit.   SF IV and CW vitals.

## 2021-02-24 NOTE — Progress Notes (Signed)
Report given to PACU, vss 

## 2021-02-24 NOTE — Patient Instructions (Signed)
Handouts on polyps, hemorrhoids, and diverticula given to you today   YOU HAD AN ENDOSCOPIC PROCEDURE TODAY AT [place]:   Refer to the procedure report that was given to you for any specific questions about what was found during the examination.  If the procedure report does not answer your questions, please call your gastroenterologist to clarify.  If you requested that your care partner not be given the details of your procedure findings, then the procedure report has been included in a sealed envelope for you to review at your convenience later.  YOU SHOULD EXPECT: Some feelings of bloating in the abdomen. Passage of more gas than usual.  Walking can help get rid of the air that was put into your GI tract during the procedure and reduce the bloating. If you had a lower endoscopy (such as a colonoscopy or flexible sigmoidoscopy) you may notice spotting of blood in your stool or on the toilet paper. If you underwent a bowel prep for your procedure, you may not have a normal bowel movement for a few days.  Please Note:  You might notice some irritation and congestion in your nose or some drainage.  This is from the oxygen used during your procedure.  There is no need for concern and it should clear up in a day or so.  SYMPTOMS TO REPORT IMMEDIATELY:  Following lower endoscopy (colonoscopy or flexible sigmoidoscopy):  Excessive amounts of blood in the stool  Significant tenderness or worsening of abdominal pains  Swelling of the abdomen that is new, acute  Fever of 100F or higher   For urgent or emergent issues, a gastroenterologist can be reached at any hour by calling [phone]. Do not use MyChart messaging for urgent concerns.    DIET:  We do recommend a small meal at first, but then you may proceed to your regular diet.  Drink plenty of fluids but you should avoid alcoholic beverages for 24 hours.  ACTIVITY:  You should plan to take it easy for the rest of today  and you should NOT DRIVE or use heavy machinery until tomorrow (because of the sedation medicines used during the test).    FOLLOW UP: Our staff will call the number listed on your records 48-72 hours following your procedure to check on you and address any questions or concerns that you may have regarding the information given to you following your procedure. If we do not reach you, we will leave a message.  We will attempt to reach you two times.  During this call, we will ask if you have developed any symptoms of COVID 19. If you develop any symptoms (ie: fever, flu-like symptoms, shortness of breath, cough etc.) before then, please call [phone].  If you test positive for Covid 19 in the 2 weeks post procedure, please call and report this information to [place].    If any biopsies were taken you will be contacted by phone or by letter within the next 1-3 weeks.  Please call us at [phone] if you have not heard about the biopsies in 3 weeks.    SIGNATURES/CONFIDENTIALITY: You and/or your care partner have signed paperwork which will be entered into your electronic medical record.  These signatures attest to the fact that that the information above on your After Visit Summary has been reviewed and is understood.  Full responsibility of the confidentiality of this discharge information lies with you and/or your care-partner.

## 2021-02-24 NOTE — Op Note (Signed)
[place] Patient Name: [name] Procedure Date: 02/24/2021 1:09 PM MRN: [id number] Endoscopist: Jerene Bears , MD Age: 65 Referring MD:  Date of Birth: 09-15-1955 Gender: Male Account #: [id number] Procedure:                Colonoscopy Indications:              Screening for colorectal malignant neoplasm, This                            is the patient's first colonoscopy Medicines:                Monitored Anesthesia Care Procedure:                Pre-Anesthesia Assessment:                           - Prior to the procedure, a History and Physical                            was performed, and patient medications and                            allergies were reviewed. The patient's tolerance of                            previous anesthesia was also reviewed. The risks                            and benefits of the procedure and the sedation                            options and risks were discussed with the patient.                            All questions were answered, and informed consent                            was obtained. Prior Anticoagulants: The patient has                            taken no previous anticoagulant or antiplatelet                            agents. ASA Grade Assessment: II - A patient with                            mild systemic disease. After reviewing the risks                            and benefits, the patient was deemed in                            satisfactory condition to undergo the procedure.  After obtaining informed consent, the colonoscope                            was passed under direct vision. Throughout the                            procedure, the patient's blood pressure, pulse, and                            oxygen saturations were monitored continuously. The                            Olympus CF-HQ190 [id number]) Colonoscope was                            introduced through the anus and  advanced to the                            cecum, identified by appendiceal orifice and                            ileocecal valve. The colonoscopy was performed                            without difficulty. The patient tolerated the                            procedure well. The quality of the bowel                            preparation was good. The ileocecal valve,                            appendiceal orifice, and rectum were photographed. Scope In: 1:10:51 PM Scope Out: 1:31:41 PM Scope Withdrawal Time: 0 hours 15 minutes 35 seconds  Total Procedure Duration: 0 hours 20 minutes 50 seconds  Findings:                 The digital rectal exam was normal.                           A 4 mm polyp was found in the cecum. The polyp was                            sessile. The polyp was removed with a cold biopsy                            forceps. Resection and retrieval were complete.                           A 4 mm polyp was found in the ascending colon. The                            polyp was sessile. The  polyp was removed with a                            cold snare. Resection and retrieval were complete.                           Three sessile polyps were found in the transverse                            colon. The polyps were 3 to 6 mm in size. These                            polyps were removed with a cold snare. Resection                            and retrieval were complete.                           Internal hemorrhoids were found during                            retroflexion. The hemorrhoids were small. Complications:            No immediate complications. Estimated Blood Loss:     Estimated blood loss was minimal. Impression:               - One 4 mm polyp in the cecum, removed with a cold                            biopsy forceps. Resected and retrieved.                           - One 4 mm polyp in the ascending colon, removed                            with a cold  snare. Resected and retrieved.                           - Three 3 to 6 mm polyps in the transverse colon,                            removed with a cold snare. Resected and retrieved.                           - Internal hemorrhoids. Recommendation:           - Patient has a contact number available for                            emergencies. The signs and symptoms of potential                            delayed complications were discussed with the  patient. Return to normal activities tomorrow.                            Written discharge instructions were provided to the                            patient.                           - Resume previous diet.                           - Continue present medications.                           - Await pathology results.                           - Repeat colonoscopy is recommended. The                            colonoscopy date will be determined after pathology                            results from today's exam become available for                            review. Jerene Bears, MD 02/24/2021 1:37:41 PM This report has been signed electronically.

## 2021-02-24 NOTE — Progress Notes (Signed)
Called to room to assist during endoscopic procedure.  Patient ID and intended procedure confirmed with present staff. Received instructions for my participation in the procedure from the performing physician.  

## 2021-02-26 ENCOUNTER — Telehealth: Payer: Self-pay

## 2021-02-26 NOTE — Telephone Encounter (Signed)
LVM

## 2021-03-02 ENCOUNTER — Encounter: Payer: Self-pay | Admitting: Internal Medicine

## 2021-03-22 ENCOUNTER — Other Ambulatory Visit: Payer: Self-pay | Admitting: Family Medicine

## 2021-03-22 DIAGNOSIS — E119 Type 2 diabetes mellitus without complications: Secondary | ICD-10-CM

## 2021-04-21 ENCOUNTER — Other Ambulatory Visit: Payer: Self-pay | Admitting: Family Medicine

## 2021-04-21 DIAGNOSIS — E119 Type 2 diabetes mellitus without complications: Secondary | ICD-10-CM

## 2021-06-25 ENCOUNTER — Other Ambulatory Visit: Payer: Self-pay | Admitting: Family Medicine

## 2021-06-25 DIAGNOSIS — E119 Type 2 diabetes mellitus without complications: Secondary | ICD-10-CM

## 2021-06-26 ENCOUNTER — Telehealth: Payer: Self-pay

## 2021-06-26 ENCOUNTER — Other Ambulatory Visit: Payer: Self-pay

## 2021-06-26 ENCOUNTER — Encounter (HOSPITAL_COMMUNITY): Payer: Self-pay | Admitting: *Deleted

## 2021-06-26 ENCOUNTER — Ambulatory Visit (HOSPITAL_COMMUNITY)
Admission: EM | Admit: 2021-06-26 | Discharge: 2021-06-26 | Disposition: A | Discharge status: Home / Self Care | Payer: Medicare Other | Attending: Emergency Medicine | Admitting: Emergency Medicine

## 2021-06-26 DIAGNOSIS — K5900 Constipation, unspecified: Secondary | ICD-10-CM

## 2021-06-26 DIAGNOSIS — R1033 Periumbilical pain: Secondary | ICD-10-CM

## 2021-06-26 MED ORDER — POLYETHYLENE GLYCOL 3350 17 GM/SCOOP PO POWD: 17.0000 g | Freq: Every day | ORAL | 0 refills | Status: DC

## 2021-06-26 MED ORDER — DOCUSATE SODIUM 100 MG PO CAPS: 100.0000 mg | ORAL_CAPSULE | Freq: Two times a day (BID) | ORAL | 0 refills | Status: DC

## 2021-06-26 MED ORDER — GLYCERIN (ADULT) 2 G RE SUPP: 1.0000 | Freq: Once | RECTAL | 0 refills | Status: DC | PRN

## 2021-06-26 NOTE — ED Provider Notes (Signed)
HPI  SUBJECTIVE:  [name] is a 65 y.o. male who presents with intermittent, 20- 30 minutes long episodes of periumbilical pain for the past 3 days after eating some muscidine grapes, cabbage with bacon and sausage.  He describes it as a ache and a "funny feeling".  It does not migrate or radiate.  He had 2 days of constipation with a hard bowel movement today which improved his abdominal pain.  No fevers, nausea, vomiting, distention, urinary retention, melena, hematochezia.  He states that the abdominal pain is getting better after having a bowel movement.  He has tried Tums and Pepto-Bismol.  Reports black stool now.  Symptoms are also better with eating.  No aggravating factors.  He has a past medical history of diabetes and states his glucose has been running within normal limits.  Also has a history of hypertension, and is a smoker.  He had a normal colonoscopy a few months ago.  No history of abdominal surgeries, pancreatitis, alcohol abuse, GI bleed, gallbladder disease, MI, hypercholesterolemia, aortic abdominal aneurysm.  PMD: [facility].  He has follow-up with them in November 1.   Past Medical History:  Diagnosis Date   Diabetes mellitus without complication ([place])    Hyperlipidemia     History reviewed. No pertinent surgical history.  Family History  Problem Relation Age of Onset   Diabetes Mother    Diabetes Brother    Colon polyps Neg Hx    Esophageal cancer Neg Hx    Rectal cancer Neg Hx    Stomach cancer Neg Hx     Social History   Tobacco Use   Smoking status: Every Day    Packs/day: 0.50    Years: 25.00    Pack years: 12.50    Types: Cigarettes   Smokeless tobacco: Current  Substance Use Topics   Alcohol use: Never   Drug use: Never    No current facility-administered medications for this encounter.  Current Outpatient Medications:    docusate sodium (COLACE) 100 MG capsule, Take 1 capsule (100 mg total) by mouth every 12 (twelve) hours.,  Disp: 20 capsule, Rfl: 0   glycerin adult 2 g suppository, Place 1 suppository rectally once as needed (constipation)., Disp: 12 suppository, Rfl: 0   polyethylene glycol powder (GLYCOLAX/MIRALAX) 17 GM/SCOOP powder, Take 17 g by mouth daily., Disp: 255 g, Rfl: 0   atorvastatin (LIPITOR) 20 MG tablet, Take 1 tablet by mouth once daily, Disp: 180 tablet, Rfl: 0   diclofenac Sodium (VOLTAREN) 1 % GEL, APPLY 2 GRAMS TOPICALLY 4 TIMES DAILY, Disp: 400 g, Rfl: 0   glucose blood (IGLUCOSE TEST STRIPS) test strip, Use as instructed, Disp: 100 each, Rfl: 12   lactulose (CONSTULOSE) 10 GM/15ML solution, TAKE 15 MLS BY MOUTH DAILY AS NEEDED FOR MILD CONSTIPATION, Disp: 474 mL, Rfl: 0   metFORMIN (GLUCOPHAGE) 500 MG tablet, TAKE 1 TABLET BY MOUTH TWICE DAILY WITH A MEAL, Disp: 180 tablet, Rfl: 0  No Known Allergies   ROS  As noted in HPI.   Physical Exam  BP (!) 147/81   Pulse 70   Temp 98.1 F (36.7 C)   Resp 20   SpO2 97%   Constitutional: Well developed, well nourished, no acute distress Eyes: PERRL, EOMI, conjunctiva normal bilaterally HENT: Normocephalic, atraumatic,mucus membranes moist Respiratory: Clear to auscultation bilaterally, no rales, no wheezing, no rhonchi Cardiovascular: Normal rate and rhythm, no murmurs, no gallops, no rubs GI: No surgical scars.  Positive nontender reducible umbilical hernia.  Soft, nondistended, normal bowel sounds, nontender, no rebound, no guarding.  Negative Murphy, negative McBurney.  No pulsatile abdominal mass. Back: no CVAT skin: No rash, skin intact Musculoskeletal: No deformities Neurologic: Alert & oriented x 3, CN III-XII grossly intact, no motor deficits, sensation grossly intact Psychiatric: Speech and behavior appropriate   ED Course   Medications - No data to display  No orders of the defined types were placed in this encounter.  No results found for this or any previous visit (from the past 24 hour(s)). No results found.  ED  Clinical Impression  1. Periumbilical abdominal pain   2. Constipation, unspecified constipation type      ED Assessment/Plan  Patient with periumbilical pain and constipation for the past 2 days, states that the pain is getting better, particularly after having a bowel movement this morning.  He declined rectal exam.  He states that his glucose has been running within normal limits for him, and that he drinks only 1 bottle of water a day.  He had a normal colonoscopy few months ago.  Suspect abdominal pain is coming from constipation.  Doubt ACS, obstruction, perforation, pancreatitis, cholelithiasis incarcerated or strangulated hernia, aortic abdominal aneurysm.  Suspect black stool is from the Pepto-Bismol.  Sending home with increased fluids, MiraLAX, docusate, glycerin suppositories.  Follow-up with PMD in several days if not getting any better.  ER return precautions given.  Discussed MDM, treatment plan, and plan for follow-up with patient Discussed sn/sx that should prompt return to the ED. patient agrees with plan.   Meds ordered this encounter  Medications   glycerin adult 2 g suppository    Sig: Place 1 suppository rectally once as needed (constipation).    Dispense:  12 suppository    Refill:  0   docusate sodium (COLACE) 100 MG capsule    Sig: Take 1 capsule (100 mg total) by mouth every 12 (twelve) hours.    Dispense:  20 capsule    Refill:  0   polyethylene glycol powder (GLYCOLAX/MIRALAX) 17 GM/SCOOP powder    Sig: Take 17 g by mouth daily.    Dispense:  255 g    Refill:  0      *This clinic note was created using [employer]. Therefore, there may be occasional mistakes despite careful proofreading. ?    Melynda Ripple, MD 06/28/21 [phone]

## 2021-06-26 NOTE — Telephone Encounter (Signed)
Patient walks into [facility] clinic requesting to schedule same day appointment for abdominal pain and black stools. Patient was assisted to exam room and obtained vital signs and interviewed about current symptoms.   Patient reports that he "ate something on Sunday that didn't agree with my stomach." Patient reports eating fried cabbage, bacon and sausage. Since then, patient has been experiencing intermittent abdominal pain. Reports that the pain is "all over." States that he has been using milk of magnesia and tums for symptom management. Denies fever, vomiting, rectal bleeding, and dizziness. Patient reports that he had last BM today and was hard and black.   Vital signs were as follows. BP: 122/78, HR: 69, SpO2: 97%. Patient does not appear to be in distress.   Precepted with Dr. Ardelia Mems who recommended that patient be seen in UC, as we do not have any appointments today. Provided patient with provider recommendations. Patient will now go to UC for further evaluation of concern.   Talbot Grumbling, RN

## 2021-06-26 NOTE — Discharge Instructions (Addendum)
Make sure you drink at least 2 L or 64 ounces of water a day.  Try the MiraLAX, docusate and glycerin suppositories.  It may take up to 3 days for the MiraLAX to have its full effect.  You can try magnesium citrate if you do not have any results.  Immediately to the ER for abdominal distention, change in your pain, vomiting, fevers, or for any other concerns

## 2021-06-26 NOTE — ED Triage Notes (Signed)
Pt reports he ate some food Sunday night that made his stomach hurt and burn. Pt took pepto  For stomach and his stool is black now.

## 2021-06-27 ENCOUNTER — Ambulatory Visit: Payer: Medicare Other

## 2021-07-15 ENCOUNTER — Other Ambulatory Visit: Payer: Self-pay

## 2021-07-15 ENCOUNTER — Encounter: Payer: Self-pay | Admitting: Family Medicine

## 2021-07-15 ENCOUNTER — Ambulatory Visit (INDEPENDENT_AMBULATORY_CARE_PROVIDER_SITE_OTHER): Payer: Medicare Other | Admitting: Family Medicine

## 2021-07-15 ENCOUNTER — Other Ambulatory Visit: Payer: Self-pay | Admitting: Family Medicine

## 2021-07-15 VITALS — BP 130/78 | HR 65 | Ht 74.0 in | Wt 191.6 lb

## 2021-07-15 DIAGNOSIS — E119 Type 2 diabetes mellitus without complications: Secondary | ICD-10-CM | POA: Diagnosis not present

## 2021-07-15 LAB — POCT GLYCOSYLATED HEMOGLOBIN (HGB A1C): HbA1c, POC (prediabetic range): 6.4 % (ref 5.7–6.4)

## 2021-07-15 MED ORDER — BLOOD PRESSURE KIT: PACK | 0 refills | Status: AC

## 2021-07-15 NOTE — Patient Instructions (Signed)
Thank you for coming to see me today. It was a pleasure.   You should pay attention to your hemoglobin A1C.  It is a three month test about your average blood sugar. If the A1C is - <7.0 is great.  That is our goal for treating you. - Between 7.0 and 9.0 is not so good.  We would need to work to do better. - Above 9.0 is terrible.  You would really need to work with [place] to get it under control.    Today's A1C = 6.4  Last A1c 6.2  Check your blood pressure 2-3 times a week.  If your readings are consistently above 130/80 please schedule an appointment with me before the 6 month follow up.  Please follow-up with PCP in 6 months  If you have any questions or concerns, please do not hesitate to call the office at [phone].  Best,   Carollee Leitz, MD 3

## 2021-07-15 NOTE — Assessment & Plan Note (Signed)
A1c 6.4 today, essentially unchanged from previous visit.  Asymptomatic. BP not at goal for DM <120/80 -Continue Metformin -Monitor BP at home and record.  If continues to be elevated can discuss addition of ACE/ARB -If BP remains >120/80 advised to schedule appointment prior to 6 month visit -Urine micro/alb today -Follow up A1c in 6 months

## 2021-07-15 NOTE — Progress Notes (Signed)
    SUBJECTIVE:   CHIEF COMPLAINT / HPI: A1c check  Type 2 DM Asymptomatic.  Reports home health checked A1c few months ago and in 5 range. Does not check sugars at home.  Compliant with Metformin.     PERTINENT  PMH / PSH:  DM Type 2  OBJECTIVE:   BP 130/78   Pulse 65   Ht 6\' 2"  (1.88 m)   Wt 191 lb 9.6 oz (86.9 kg)   SpO2 97%   BMI 24.60 kg/m    General: Alert, no acute distress Cardio: Normal S1 and S2, RRR, no r/m/g Pulm: CTAB, normal work of breathing Abdomen: Bowel sounds normal. Abdomen soft and non-tender.  Extremities: No peripheral edema.   ASSESSMENT/PLAN:   Type 2 diabetes mellitus without complication (HCC) O2H 6.4 today, essentially unchanged from previous visit.  Asymptomatic. BP not at goal for DM <120/80 -Continue Metformin -Monitor BP at home and record.  If continues to be elevated can discuss addition of ACE/ARB -If BP remains >120/80 advised to schedule appointment prior to 6 month visit -Urine micro/alb today -Follow up A1c in 6 months   Carollee Leitz, MD [place]

## 2021-07-16 LAB — MICROALBUMIN / CREATININE URINE RATIO
Creatinine, Urine: 91.5 mg/dL
Microalb/Creat Ratio: <3 mg/g creat (ref 0–29)
Microalbumin, Urine: <3 ug/mL

## 2021-07-24 ENCOUNTER — Other Ambulatory Visit: Payer: Self-pay | Admitting: Family Medicine

## 2021-08-06 ENCOUNTER — Emergency Department (HOSPITAL_COMMUNITY): Payer: Medicare Other

## 2021-08-06 ENCOUNTER — Encounter (HOSPITAL_COMMUNITY): Payer: Self-pay | Admitting: Emergency Medicine

## 2021-08-06 ENCOUNTER — Other Ambulatory Visit: Payer: Self-pay

## 2021-08-06 ENCOUNTER — Emergency Department (HOSPITAL_COMMUNITY)
Admission: EM | Admit: 2021-08-06 | Discharge: 2021-08-06 | Disposition: A | Discharge status: Home / Self Care | Payer: Medicare Other | Attending: Emergency Medicine | Admitting: Emergency Medicine

## 2021-08-06 DIAGNOSIS — S299XXA Unspecified injury of thorax, initial encounter: Secondary | ICD-10-CM | POA: Diagnosis present

## 2021-08-06 DIAGNOSIS — M545 Low back pain, unspecified: Secondary | ICD-10-CM | POA: Diagnosis not present

## 2021-08-06 DIAGNOSIS — E119 Type 2 diabetes mellitus without complications: Secondary | ICD-10-CM | POA: Diagnosis not present

## 2021-08-06 DIAGNOSIS — S0990XA Unspecified injury of head, initial encounter: Secondary | ICD-10-CM | POA: Diagnosis not present

## 2021-08-06 DIAGNOSIS — W19XXXA Unspecified fall, initial encounter: Secondary | ICD-10-CM

## 2021-08-06 DIAGNOSIS — S22089A Unspecified fracture of T11-T12 vertebra, initial encounter for closed fracture: Secondary | ICD-10-CM | POA: Diagnosis not present

## 2021-08-06 DIAGNOSIS — W1789XA Other fall from one level to another, initial encounter: Secondary | ICD-10-CM | POA: Diagnosis not present

## 2021-08-06 DIAGNOSIS — F17210 Nicotine dependence, cigarettes, uncomplicated: Secondary | ICD-10-CM | POA: Diagnosis not present

## 2021-08-06 DIAGNOSIS — Z79899 Other long term (current) drug therapy: Secondary | ICD-10-CM | POA: Diagnosis not present

## 2021-08-06 DIAGNOSIS — Z7984 Long term (current) use of oral hypoglycemic drugs: Secondary | ICD-10-CM | POA: Insufficient documentation

## 2021-08-06 DIAGNOSIS — S3210XA Unspecified fracture of sacrum, initial encounter for closed fracture: Secondary | ICD-10-CM | POA: Insufficient documentation

## 2021-08-06 NOTE — ED Triage Notes (Signed)
Patient arrives ambulatory c/o falling from an 4ft ladder landing on his tailbone and right elbow. Denies hitting his head. No numbness or tingling. Moving all extremities. C/o pain to lower back and tailbone.

## 2021-08-06 NOTE — Discharge Instructions (Addendum)
Rotate tylenol and motrin for pain  You will need to follow up with neurosurgery in 2 weeks for reassessment and repeat xrays.   Please call the office today to schedule an appointment for follow up  Return to the emergency department immediately if you experience any back pain associated with fevers, loss of control of your bowels/bladder, weakness/numbness to your legs, numbness to your groin area, inability to walk, or inability to urinate.

## 2021-08-06 NOTE — ED Provider Notes (Signed)
[facility] EMERGENCY DEPARTMENT Provider Note   CSN: [id number] Arrival date & time: 08/06/21  1145     History Chief Complaint  Patient presents with   Lytle Michaels    [name] is a 65 y.o. male.  HPI  65 year old male with a history of diabetes, hyperlipidemia, who presents the emergency department today for evaluation after a fall.  States he was on a ladder that was about 10 feet high when he fell and landed on his backside.  He is complaining of pain to the lower spine and sacrum.  He does not think that he hit his head and denies LOC.  He is not anticoagulated.  He states that his pain is mild.  He has been able to ambulate since this occurred.  There was no reported numbness weakness or loss of control of bowel or bladder function. No cp/sob  Past Medical History:  Diagnosis Date   Diabetes mellitus without complication ([place])    Hyperlipidemia     Patient Active Problem List   Diagnosis Date Noted   Weight loss, unintentional 01/20/2021   Acute exacerbation of chronic low back pain 08/12/2020   Elevated blood pressure reading in office without diagnosis of hypertension 07/29/2020   Hav (hallux abducto valgus), unspecified laterality 07/23/2020   Health care maintenance 04/10/2020   Hypercalcemia 01/24/2020   Tobacco dependence 01/23/2020   Conductive hearing loss, bilateral 10/23/2019   Left shoulder pain 03/06/2019   Chronic right shoulder pain 02/17/2019   Cough 08/31/2018   Arthritis 08/31/2018   Hyperlipidemia 06/07/2018   Type 2 diabetes mellitus without complication ([place]) 38/88/2800   Constipation 06/07/2018    History reviewed. No pertinent surgical history.     Family History  Problem Relation Age of Onset   Diabetes Mother    Diabetes Brother    Colon polyps Neg Hx    Esophageal cancer Neg Hx    Rectal cancer Neg Hx    Stomach cancer Neg Hx     Social History   Tobacco Use   Smoking status: Every Day    Packs/day: 0.50     Years: 25.00    Pack years: 12.50    Types: Cigarettes   Smokeless tobacco: Current  Substance Use Topics   Alcohol use: Never   Drug use: Never    Home Medications Prior to Admission medications   Medication Sig Start Date End Date Taking? Authorizing Provider  atorvastatin (LIPITOR) 20 MG tablet Take 1 tablet by mouth once daily 04/24/21   Carollee Leitz, MD  Blood Pressure KIT Check blood pressure 2-3 time a week 07/15/21   Carollee Leitz, MD  diclofenac Sodium (VOLTAREN) 1 % GEL APPLY 2 GRAMS TOPICALLY 4 TIMES DAILY 01/20/21   Carollee Leitz, MD  docusate sodium (COLACE) 100 MG capsule Take 1 capsule (100 mg total) by mouth every 12 (twelve) hours. 06/26/21   Melynda Ripple, MD  glucose blood (IGLUCOSE TEST STRIPS) test strip Use as instructed 06/28/18   Nuala Alpha, MD  glycerin adult 2 g suppository Place 1 suppository rectally once as needed (constipation). 06/26/21   Melynda Ripple, MD  metFORMIN (GLUCOPHAGE) 500 MG tablet TAKE 1 TABLET BY MOUTH TWICE DAILY WITH A MEAL 07/24/21   Carollee Leitz, MD  polyethylene glycol powder (GLYCOLAX/MIRALAX) 17 GM/SCOOP powder Take 17 g by mouth daily. 06/26/21   Melynda Ripple, MD    Allergies    Patient has no known allergies.  Review of Systems   Review of Systems  Constitutional:  Negative for fever.  Eyes:  Negative for visual disturbance.  Respiratory:  Negative for shortness of breath.   Cardiovascular:  Negative for chest pain.  Gastrointestinal:  Negative for abdominal pain, nausea and vomiting.  Genitourinary:  Negative for flank pain.  Musculoskeletal:  Positive for back pain.  Skin:  Negative for rash.  Neurological:  Negative for weakness and numbness.       No loc  All other systems reviewed and are negative.  Physical Exam Updated Vital Signs BP 136/75 (BP Location: Right Arm)   Pulse (!) 59   Temp (!) 97.5 F (36.4 C) (Oral)   Resp 17   Ht 6' 2"  (1.88 m)   Wt 86.6 kg   SpO2 98%   BMI 24.52 kg/m    Physical Exam Vitals and nursing note reviewed.  Constitutional:      General: He is not in acute distress.    Appearance: He is well-developed.  HENT:     Head: Normocephalic and atraumatic.  Eyes:     Conjunctiva/sclera: Conjunctivae normal.  Cardiovascular:     Rate and Rhythm: Normal rate.  Pulmonary:     Effort: Pulmonary effort is normal.     Comments: Equal chest rise and fall Abdominal:     General: Abdomen is flat.  Musculoskeletal:        General: No swelling.     Cervical back: Neck supple.     Comments: Ambulatory. No cervical spine ttp. Mild lumbar spine ttp and sacral ttp  Skin:    General: Skin is warm and dry.     Capillary Refill: Capillary refill takes less than 2 seconds.  Neurological:     General: No focal deficit present.     Mental Status: He is alert.     Sensory: No sensory deficit.     Motor: No weakness.     Coordination: Coordination normal.  Psychiatric:        Mood and Affect: Mood normal.    ED Results / Procedures / Treatments   Labs (all labs ordered are listed, but only abnormal results are displayed) Labs Reviewed - No data to display  EKG None  Radiology DG Lumbar Spine Complete  Result Date: 08/06/2021 CLINICAL DATA:  Post fall from 8 foot ladder landing on tailbone now with low back and tailbone pain. EXAM: LUMBAR SPINE - COMPLETE 4+ VIEW COMPARISON:  Pelvic and sacral radiographs-earlier same day; lumbar spine radiographs-09/17/2018 FINDINGS: There are 5 non rib-bearing lumbar type vertebral bodies. Mild scoliotic curvature of the thoracolumbar spine with caudal component convex the right measuring approximately 16 degrees (as measured from the superior endplate of K32 to the inferior endplate of L4), similar to the 09/2018 examination. No anterolisthesis or retrolisthesis. No definite pars defects. Mild (approximately 25%) compression deformity involving the superior endplate of the X61 vertebral body, age indeterminate though  new compared to the 09/2018 examination. Remaining lumbar vertebral body heights appear preserved Mild to moderate multilevel lumbar spine DDD, worse at L1-L2 and L2-L3 with disc space height loss, endplate irregularity and small posteriorly directed disc osteophyte complexes at these locations. Limited visualization of the bilateral SI joints is normal. Regional bowel gas pattern and soft tissues appear normal. IMPRESSION: 1. Age-indeterminate mild (approximately 25%) compression deformity involving the superior endplate of the Y70 vertebral body, new compared to lumbar spine radiographs performed 09/2018. Correlation for point tenderness at this location is advised. 2. Mild-to-moderate multilevel lumbar spine DDD, worse at L1-L2 and L2-L3. Electronically Signed  By: Sandi Mariscal M.D.   On: 08/06/2021 12:58   DG Pelvis 1-2 Views  Result Date: 08/06/2021 CLINICAL DATA:  [place] from 8 foot ladder onto tailbone. EXAM: PELVIS - 1-2 VIEW COMPARISON:  Sacral and lumbar spine radiographs-earlier same day. FINDINGS: No definite displaced pelvic fracture. Mild degenerative change of the bilateral hips is suspected though incompletely evaluated. Dystrophic calcifications overlies expected location of the prostate gland. Multiple phleboliths overlie the left hemipelvis. Regional soft tissues appear otherwise normal. IMPRESSION: No definite displaced pelvic fracture. Electronically Signed   By: Sandi Mariscal M.D.   On: 08/06/2021 12:53   DG Sacrum/Coccyx  Result Date: 08/06/2021 CLINICAL DATA:  Post fall from 8 foot ladder landing on tailbone. EXAM: SACRUM AND COCCYX - 2+ VIEW COMPARISON:  Pelvic and lumbar spine radiographs-earlier same day FINDINGS: There is a potential nondisplaced fracture involving the caudal aspect of the sacrum without definitive extension to involve either of the SI joints. No additional fractures identified. Limited visualization the bilateral SI joints and pubic symphysis is normal Dystrophic  calcifications overlies expected location of the prostate gland. Multiple phleboliths overlie the left hemipelvis. No radiopaque foreign body. IMPRESSION: Potential nondisplaced fracture involving the caudal aspect the sacrum. Correlation point tenderness at this location is advised. Further evaluation with pelvic CT or MRI could be performed as indicated. Electronically Signed   By: Sandi Mariscal M.D.   On: 08/06/2021 12:55   CT Head Wo Contrast  Result Date: 08/06/2021 CLINICAL DATA:  Head trauma, moderate/severe. Additional history provided: Fall off 8 foot ladder landing on tailbone and right elbow. EXAM: CT HEAD WITHOUT CONTRAST TECHNIQUE: Contiguous axial images were obtained from the base of the skull through the vertex without intravenous contrast. COMPARISON:  Brain MRI 08/22/2007. FINDINGS: Brain: Again demonstrated are foci of chronic encephalomalacia/gliosis within the anterior frontal and temporal lobes on the right, likely posttraumatic. Background cerebral volume is normal. There is no acute intracranial hemorrhage. No acute demarcated cortical infarct. No extra-axial fluid collection. No evidence of an intracranial mass. No midline shift. Vascular: No hyperdense vessel.  Atherosclerotic calcifications Skull: Normal. Negative for fracture or focal lesion. Sinuses/Orbits: Visualized orbits show no acute finding. Mild mucosal thickening within the left frontal and bilateral ethmoid sinuses. IMPRESSION: No evidence of acute intracranial abnormality. Redemonstrated foci of chronic encephalomalacia/gliosis within the anterior frontal and temporal lobes on the right, likely posttraumatic. Mild paranasal sinus mucosal thickening, as described. Electronically Signed   By: Kellie Simmering D.O.   On: 08/06/2021 14:40   CT Thoracic Spine Wo Contrast  Result Date: 08/06/2021 CLINICAL DATA:  [place] from an 8 foot ladder.  Back pain. EXAM: CT THORACIC AND LUMBAR SPINE WITHOUT CONTRAST TECHNIQUE: Multidetector CT  imaging of the thoracic and lumbar spine was performed without contrast. Multiplanar CT image reconstructions were also generated. COMPARISON:  Chest CT 01/31/2021 FINDINGS: CT THORACIC SPINE FINDINGS Alignment: Normal alignment in the sagittal plane. There is a left convex thoracic scoliosis. Vertebrae: Acute T12 fracture is noted involving the superior endplate with mild depression. No retropulsion or canal compromise. The other thoracic vertebral bodies are intact. The facets are normally aligned. No facet or posterior element fractures. Paraspinal and other soft tissues: No significant paraspinal findings. No posterior mediastinal mass or posterior lung lesions. Small amount of paraspinal hematoma at T12. Disc levels: No thoracic disc protrusions, spinal or foraminal stenosis. CT LUMBAR SPINE FINDINGS Segmentation: There are five lumbar type vertebral bodies. The last full intervertebral disc space is labeled L5-S1. Alignment: Normal alignment in  the sagittal plane. Mild right convex lumbar scoliosis. Vertebrae: Intact. No lumbar spine fractures are identified. The T12 fracture is again noted. Paraspinal and other soft tissues: No significant paraspinal or retroperitoneal findings. Disc levels: L1-2: No significant findings. L2-3: Bulging annulus, osteophytic ridging and mild facet disease contributing to early spinal and bilateral lateral recess stenosis. L3-4: Mild bulging annulus and mild facet disease with early spinal and bilateral lateral recess stenosis. No significant foraminal stenosis. L4-5: Bulging degenerated annulus, osteophytic ridging and moderate facet disease contributing to moderately severe to severe spinal and bilateral lateral recess stenosis. Mild foraminal narrowing on the right side. L5-S1: Mild diffuse annular bulge but no disc protrusions, spinal or foraminal stenosis. IMPRESSION: 1. Acute T12 fracture involving the superior endplate with mild depression but no retropulsion or canal  compromise. 2. No lumbar spine fractures are identified. 3. Mild degenerative lumbar spondylosis with multilevel disc disease and facet disease. This is most significant at L4-5 where there is moderately severe to severe spinal and bilateral lateral recess stenosis and mild right foraminal narrowing. Electronically Signed   By: Marijo Sanes M.D.   On: 08/06/2021 14:55   CT Lumbar Spine Wo Contrast  Result Date: 08/06/2021 CLINICAL DATA:  [place] from an 8 foot ladder.  Back pain. EXAM: CT THORACIC AND LUMBAR SPINE WITHOUT CONTRAST TECHNIQUE: Multidetector CT imaging of the thoracic and lumbar spine was performed without contrast. Multiplanar CT image reconstructions were also generated. COMPARISON:  Chest CT 01/31/2021 FINDINGS: CT THORACIC SPINE FINDINGS Alignment: Normal alignment in the sagittal plane. There is a left convex thoracic scoliosis. Vertebrae: Acute T12 fracture is noted involving the superior endplate with mild depression. No retropulsion or canal compromise. The other thoracic vertebral bodies are intact. The facets are normally aligned. No facet or posterior element fractures. Paraspinal and other soft tissues: No significant paraspinal findings. No posterior mediastinal mass or posterior lung lesions. Small amount of paraspinal hematoma at T12. Disc levels: No thoracic disc protrusions, spinal or foraminal stenosis. CT LUMBAR SPINE FINDINGS Segmentation: There are five lumbar type vertebral bodies. The last full intervertebral disc space is labeled L5-S1. Alignment: Normal alignment in the sagittal plane. Mild right convex lumbar scoliosis. Vertebrae: Intact. No lumbar spine fractures are identified. The T12 fracture is again noted. Paraspinal and other soft tissues: No significant paraspinal or retroperitoneal findings. Disc levels: L1-2: No significant findings. L2-3: Bulging annulus, osteophytic ridging and mild facet disease contributing to early spinal and bilateral lateral recess  stenosis. L3-4: Mild bulging annulus and mild facet disease with early spinal and bilateral lateral recess stenosis. No significant foraminal stenosis. L4-5: Bulging degenerated annulus, osteophytic ridging and moderate facet disease contributing to moderately severe to severe spinal and bilateral lateral recess stenosis. Mild foraminal narrowing on the right side. L5-S1: Mild diffuse annular bulge but no disc protrusions, spinal or foraminal stenosis. IMPRESSION: 1. Acute T12 fracture involving the superior endplate with mild depression but no retropulsion or canal compromise. 2. No lumbar spine fractures are identified. 3. Mild degenerative lumbar spondylosis with multilevel disc disease and facet disease. This is most significant at L4-5 where there is moderately severe to severe spinal and bilateral lateral recess stenosis and mild right foraminal narrowing. Electronically Signed   By: Marijo Sanes M.D.   On: 08/06/2021 14:55   CT PELVIS WO CONTRAST  Result Date: 08/06/2021 CLINICAL DATA:  [place].  Pelvic pain. EXAM: CT PELVIS WITHOUT CONTRAST TECHNIQUE: Multidetector CT imaging of the pelvis was performed following the standard protocol without intravenous contrast. COMPARISON:  None. FINDINGS: Urinary Tract: The bladder is grossly normal. No bladder mass or calculi. Bowel: The rectum, sigmoid colon and visualized small-bowel loops are grossly normal. Vascular/Lymphatic: The distal aorta and pelvic vasculature are unremarkable. No adenopathy. Reproductive: The prostate gland and seminal vesicles are unremarkable. Other: No free pelvic fluid collections or pelvic hematoma. No inguinal adenopathy or inguinal hernia. Musculoskeletal: Moderate degenerative changes involving both SI joints are noted. The pubic symphysis is intact. Both hips are normally located. Minimal/mild degenerative changes. On the sagittal reformatted images there is a mildly displaced fracture involving the lower sacrum. The other sacral  elements are intact. The iliac bones are intact. IMPRESSION: 1. Mildly displaced lower sacral fracture. 2. No other significant bony findings. Electronically Signed   By: Marijo Sanes M.D.   On: 08/06/2021 15:05    Procedures Procedures   Medications Ordered in ED Medications - No data to display  ED Course  I have reviewed the triage vital signs and the nursing notes.  Pertinent labs & imaging results that were available during my care of the patient were reviewed by me and considered in my medical decision making (see chart for details).    MDM Rules/Calculators/A&P                          65 y/o male here for fall from a height. C/o lower back pain and sacral pain  Reviewed/interpreted imaging  Xray sacrum/coccyx - Potential nondisplaced fracture involving the caudal aspect the sacrum. Correlation point tenderness at this location is advised. Xray pelvis - No definite displaced pelvic fracture. Xray lumbar spine - 1. Age-indeterminate mild (approximately 25%) compression deformity involving the superior endplate of the J03 vertebral body, new compared to lumbar spine radiographs performed 09/2018. 2. Mild-to-moderate multilevel lumbar spine DDD, worse at L1-L2 and L2-L3. CT head - No evidence of acute intracranial abnormality. Redemonstrated foci of chronic encephalomalacia/gliosis within the anterior frontal and temporal lobes on the right, likely posttraumatic. Mild paranasal sinus mucosal thickening, as described. CT lumbar/thoracic spine - 1. Acute T12 fracture involving the superior endplate with mild depression but no retropulsion or canal compromise. 2. No lumbar spine fractures are identified. 3. Mild degenerative lumbar spondylosis with multilevel disc disease and facet disease. This is most significant at L4-5 where there is moderately severe to severe spinal and bilateral lateral recess stenosis and mild right foraminal narrowing. CT pelvis - 1. Mildly displaced lower sacral  fracture. 2. No other significant bony findings.  3:34 PM CONSULT with Dr. Glenford Peers with neurosurgery.  He recommends the patient follow-up for repeat x-rays in 2 weeks.  TLSO brace is optional and mainly for comfort if patient prefers.  Reassessed patient, discussed plan for discharge with neurosurgery follow-up.  I offered TLSO brace however patient prefers not to wait for the brace as he states he is in minimal pain at this time.  He is neurologically intact.  Feel he is appropriate for discharge.  Referral for neurosurgery given.  Advised on return precautions patient voices understanding the plan and reasons to return.  All questions answered.  Patient stable for discharge  Final Clinical Impression(s) / ED Diagnoses Final diagnoses:  Fall, initial encounter  Closed fracture of twelfth thoracic vertebra, unspecified fracture morphology, initial encounter ([place])  Closed fracture of sacrum, unspecified portion of sacrum, initial encounter [facility])    Rx / [place] Orders ED Discharge Orders     None        [name]  S, PA-C 08/06/21 1601    Lorelle Gibbs, DO 08/07/21 2258

## 2021-08-06 NOTE — ED Provider Notes (Signed)
Emergency Medicine Provider Triage Evaluation Note  Kentravious Lipford , a 65 y.o. male  was evaluated in triage.  Pt complains of a fall pta. States he fell 10 ft from a ladder and landed on his buttocks. He denies head trauma or loc. No anticoagulated. Reports lower back pain.  Review of Systems  Positive: Lower back pain Negative: Head trauma, loc  Physical Exam  BP 136/75 (BP Location: Right Arm)   Pulse (!) 59   Temp (!) 97.5 F (36.4 C) (Oral)   Resp 17   Ht 6\' 2"  (1.88 m)   Wt 86.6 kg   SpO2 98%   BMI 24.52 kg/m  Gen:   Awake, no distress   Resp:  Normal effort  MSK:   Moves extremities without difficulty  Other:  Ambulatory in room. Clear speech, no facial droop. Minimal ttp to the lumbar spine  Medical Decision Making  Medically screening exam initiated at 12:21 PM.  Appropriate orders placed.  Winfrey Chillemi was informed that the remainder of the evaluation will be completed by another provider, this initial triage assessment does not replace that evaluation, and the importance of remaining in the ED until their evaluation is complete.     Rodney Booze, PA-C 08/06/21 1223    Lorelle Gibbs, DO 08/06/21 1624

## 2021-08-10 ENCOUNTER — Other Ambulatory Visit: Payer: Self-pay | Admitting: Family Medicine

## 2021-08-11 ENCOUNTER — Other Ambulatory Visit: Payer: Self-pay | Admitting: Family Medicine

## 2021-08-11 DIAGNOSIS — G8929 Other chronic pain: Secondary | ICD-10-CM

## 2021-08-15 ENCOUNTER — Encounter (HOSPITAL_COMMUNITY): Payer: Self-pay | Admitting: Emergency Medicine

## 2021-08-15 ENCOUNTER — Other Ambulatory Visit: Payer: Self-pay

## 2021-08-15 ENCOUNTER — Emergency Department (HOSPITAL_COMMUNITY): Payer: Medicare Other

## 2021-08-15 ENCOUNTER — Emergency Department (HOSPITAL_COMMUNITY)
Admission: EM | Admit: 2021-08-15 | Discharge: 2021-08-15 | Disposition: A | Discharge status: Home / Self Care | Payer: Medicare Other | Attending: Emergency Medicine | Admitting: Emergency Medicine

## 2021-08-15 DIAGNOSIS — Y929 Unspecified place or not applicable: Secondary | ICD-10-CM | POA: Insufficient documentation

## 2021-08-15 DIAGNOSIS — Y999 Unspecified external cause status: Secondary | ICD-10-CM | POA: Insufficient documentation

## 2021-08-15 DIAGNOSIS — F17210 Nicotine dependence, cigarettes, uncomplicated: Secondary | ICD-10-CM | POA: Insufficient documentation

## 2021-08-15 DIAGNOSIS — W1839XA Other fall on same level, initial encounter: Secondary | ICD-10-CM | POA: Insufficient documentation

## 2021-08-15 DIAGNOSIS — E119 Type 2 diabetes mellitus without complications: Secondary | ICD-10-CM | POA: Insufficient documentation

## 2021-08-15 DIAGNOSIS — Y939 Activity, unspecified: Secondary | ICD-10-CM | POA: Diagnosis not present

## 2021-08-15 DIAGNOSIS — S40021A Contusion of right upper arm, initial encounter: Secondary | ICD-10-CM | POA: Diagnosis not present

## 2021-08-15 DIAGNOSIS — G8929 Other chronic pain: Secondary | ICD-10-CM

## 2021-08-15 DIAGNOSIS — Z79899 Other long term (current) drug therapy: Secondary | ICD-10-CM | POA: Insufficient documentation

## 2021-08-15 DIAGNOSIS — M545 Low back pain, unspecified: Secondary | ICD-10-CM

## 2021-08-15 DIAGNOSIS — S40921A Unspecified superficial injury of right upper arm, initial encounter: Secondary | ICD-10-CM | POA: Diagnosis present

## 2021-08-15 MED ORDER — DICLOFENAC SODIUM 1 % EX GEL: 4.0000 g | Freq: Four times a day (QID) | CUTANEOUS | 0 refills | Status: DC

## 2021-08-15 MED ORDER — ACETAMINOPHEN 325 MG PO TABS
650.0000 mg | ORAL_TABLET | Freq: Once | ORAL | Status: AC
  Administered 2021-08-15: 650 mg via ORAL
  Filled 2021-08-15: qty 2

## 2021-08-15 MED ORDER — KETOROLAC TROMETHAMINE 15 MG/ML IJ SOLN
15.0000 mg | Freq: Once | INTRAMUSCULAR | Status: AC
  Administered 2021-08-15: 15 mg via INTRAMUSCULAR
  Filled 2021-08-15: qty 1

## 2021-08-15 NOTE — ED Triage Notes (Signed)
Pt c/o right upper arm pain after a fall last week.

## 2021-08-15 NOTE — ED Provider Notes (Signed)
[facility] EMERGENCY DEPARTMENT Provider Note   CSN: [id number] Arrival date & time: 08/15/21  0203     History Chief Complaint  Patient presents with   Arm Pain    [name] is a 65 y.o. male.  65 yo M with a chief complaints of right arm pain after blunt injury to that arm.  Tells me that he fell onto his flexed arm at the elbow.  Complaining of pain mostly along the anterior aspect of the arm.  Has been able to use it but with pain.  Has been taking Tylenol without much improvement.  He denies other injury in the fall.  Had a small abrasion to the ulnar aspect of the forearm but denies any fevers or chills.  Full range of motion.  The history is provided by the patient.  Arm Pain This is a new problem. The current episode started more than 1 week ago. The problem occurs constantly. The problem has not changed since onset.Pertinent negatives include no chest pain, no abdominal pain, no headaches and no shortness of breath. The symptoms are aggravated by bending and twisting. Nothing relieves the symptoms. He has tried nothing for the symptoms. The treatment provided no relief.      Past Medical History:  Diagnosis Date   Diabetes mellitus without complication ([place])    Hyperlipidemia     Patient Active Problem List   Diagnosis Date Noted   Weight loss, unintentional 01/20/2021   Acute exacerbation of chronic low back pain 08/12/2020   Elevated blood pressure reading in office without diagnosis of hypertension 07/29/2020   Hav (hallux abducto valgus), unspecified laterality 07/23/2020   Health care maintenance 04/10/2020   Hypercalcemia 01/24/2020   Tobacco dependence 01/23/2020   Conductive hearing loss, bilateral 10/23/2019   Left shoulder pain 03/06/2019   Chronic right shoulder pain 02/17/2019   Cough 08/31/2018   Arthritis 08/31/2018   Hyperlipidemia 06/07/2018   Type 2 diabetes mellitus without complication ([place]) 78/29/5621   Constipation  06/07/2018    History reviewed. No pertinent surgical history.     Family History  Problem Relation Age of Onset   Diabetes Mother    Diabetes Brother    Colon polyps Neg Hx    Esophageal cancer Neg Hx    Rectal cancer Neg Hx    Stomach cancer Neg Hx     Social History   Tobacco Use   Smoking status: Every Day    Packs/day: 0.50    Years: 25.00    Pack years: 12.50    Types: Cigarettes   Smokeless tobacco: Current  Substance Use Topics   Alcohol use: Never   Drug use: Never    Home Medications Prior to Admission medications   Medication Sig Start Date End Date Taking? Authorizing Provider  diclofenac Sodium (VOLTAREN) 1 % GEL Apply 4 g topically 4 (four) times daily. 08/15/21  Yes Deno Etienne, DO  atorvastatin (LIPITOR) 20 MG tablet Take 1 tablet by mouth once daily 04/24/21   Carollee Leitz, MD  Blood Pressure KIT Check blood pressure 2-3 time a week 07/15/21   Carollee Leitz, MD  docusate sodium (COLACE) 100 MG capsule Take 1 capsule (100 mg total) by mouth every 12 (twelve) hours. 06/26/21   Melynda Ripple, MD  glucose blood (IGLUCOSE TEST STRIPS) test strip Use as instructed 06/28/18   Nuala Alpha, MD  glycerin adult 2 g suppository Place 1 suppository rectally once as needed (constipation). 06/26/21   Melynda Ripple, MD  metFORMIN (GLUCOPHAGE) 500 MG tablet TAKE 1 TABLET BY MOUTH TWICE DAILY WITH A MEAL 08/12/21   Carollee Leitz, MD  polyethylene glycol powder (GLYCOLAX/MIRALAX) 17 GM/SCOOP powder Take 17 g by mouth daily. 06/26/21   Melynda Ripple, MD    Allergies    Patient has no known allergies.  Review of Systems   Review of Systems  Constitutional:  Negative for chills and fever.  HENT:  Negative for congestion and facial swelling.   Eyes:  Negative for discharge and visual disturbance.  Respiratory:  Negative for shortness of breath.   Cardiovascular:  Negative for chest pain and palpitations.  Gastrointestinal:  Negative for abdominal pain,  diarrhea and vomiting.  Musculoskeletal:  Positive for myalgias. Negative for arthralgias.  Skin:  Negative for color change and rash.  Neurological:  Negative for tremors, syncope and headaches.  Psychiatric/Behavioral:  Negative for confusion and dysphoric mood.    Physical Exam Updated Vital Signs BP (!) 159/97 (BP Location: Left Arm)   Pulse (!) 54   Resp 14   SpO2 100%   Physical Exam Vitals and nursing note reviewed.  Constitutional:      Appearance: He is well-developed.  HENT:     Head: Normocephalic and atraumatic.  Eyes:     Pupils: Pupils are equal, round, and reactive to light.  Neck:     Vascular: No JVD.  Cardiovascular:     Rate and Rhythm: Normal rate and regular rhythm.     Heart sounds: No murmur heard.   No friction rub. No gallop.  Pulmonary:     Effort: No respiratory distress.     Breath sounds: No wheezing.  Abdominal:     General: There is no distension.     Tenderness: There is no abdominal tenderness. There is no guarding or rebound.  Musculoskeletal:        General: Tenderness present. Normal range of motion.     Cervical back: Normal range of motion and neck supple.     Comments: Mild diffuse tenderness about the right upper arm from the [facility] joint down to the olecranon process.  No limit to range of motion of the shoulder or the elbow.  Has a small abrasion to the dorsal aspect of the left forearm that appears to be healing well.  No fluctuance no induration.  No rash noted to the arm.  No specific pain over the biceps tendon or the biceps musculature.  No deformity.  Skin:    Coloration: Skin is not pale.     Findings: No rash.  Neurological:     Mental Status: He is alert and oriented to person, place, and time.  Psychiatric:        Behavior: Behavior normal.    ED Results / Procedures / Treatments   Labs (all labs ordered are listed, but only abnormal results are displayed) Labs Reviewed - No data to display  EKG None  Radiology DG  Humerus Right  Result Date: 08/15/2021 CLINICAL DATA:  Fall injury. EXAM: RIGHT HUMERUS - 2+ VIEW COMPARISON:  None. FINDINGS: There is no evidence of fracture or other focal bone lesions. Soft tissues are unremarkable. IMPRESSION: Negative. Electronically Signed   By: Telford Nab M.D.   On: 08/15/2021 02:48    Procedures Procedures   Medications Ordered in ED Medications  ketorolac (TORADOL) 15 MG/ML injection 15 mg (has no administration in time range)  acetaminophen (TYLENOL) tablet 650 mg (650 mg Oral Given 08/15/21 0446)    ED Course  I have reviewed the triage vital signs and the nursing notes.  Pertinent labs & imaging results that were available during my care of the patient were reviewed by me and considered in my medical decision making (see chart for details).    MDM Rules/Calculators/A&P                           65 yo M with a chief complaint of right arm pain after a fall about a week ago.  Most likely is a muscular strain.  No obvious finding on physical exam.  Will place in a sling for comfort.  Diclofenac gel.  PCP follow-up.  8:19 AM:  I have discussed the diagnosis/risks/treatment options with the patient and believe the pt to be eligible for discharge home to follow-up with PCP. We also discussed returning to the ED immediately if new or worsening sx occur. We discussed the sx which are most concerning (e.g., sudden worsening pain, fever, inability to tolerate by mouth) that necessitate immediate return. Medications administered to the patient during their visit and any new prescriptions provided to the patient are listed below.  Medications given during this visit Medications  ketorolac (TORADOL) 15 MG/ML injection 15 mg (has no administration in time range)  acetaminophen (TYLENOL) tablet 650 mg (650 mg Oral Given 08/15/21 0446)     The patient appears reasonably screen and/or stabilized for discharge and I doubt any other medical condition or other [facility]  requiring further screening, evaluation, or treatment in the ED at this time prior to discharge.   Final Clinical Impression(s) / ED Diagnoses Final diagnoses:  Arm contusion, right, initial encounter    Rx / DC Orders ED Discharge Orders          Ordered    diclofenac Sodium (VOLTAREN) 1 % GEL  4 times daily        08/15/21 [place], [place], DO 08/15/21 [phone]

## 2021-08-15 NOTE — Discharge Instructions (Addendum)
Use the gel as prescribed.  Follow up with a family doc in the office Also take tylenol 1000mg (2 extra strength) four times a day.   The sling is for comfort only.  You do need to take it out of the sling at least 4 times a day and perform range of motion exercises.

## 2021-08-18 ENCOUNTER — Ambulatory Visit (INDEPENDENT_AMBULATORY_CARE_PROVIDER_SITE_OTHER): Payer: Medicare Other | Admitting: Family Medicine

## 2021-08-18 ENCOUNTER — Other Ambulatory Visit: Payer: Self-pay

## 2021-08-18 VITALS — BP 163/97 | HR 68 | Temp 98.5°F | Wt 189.0 lb

## 2021-08-18 DIAGNOSIS — M79601 Pain in right arm: Secondary | ICD-10-CM | POA: Diagnosis not present

## 2021-08-18 DIAGNOSIS — Z550 Illiteracy and low-level literacy: Secondary | ICD-10-CM | POA: Diagnosis not present

## 2021-08-18 DIAGNOSIS — R030 Elevated blood-pressure reading, without diagnosis of hypertension: Secondary | ICD-10-CM | POA: Diagnosis not present

## 2021-08-18 MED ORDER — GABAPENTIN 100 MG PO CAPS: 100.0000 mg | ORAL_CAPSULE | Freq: Three times a day (TID) | ORAL | 3 refills | Status: DC

## 2021-08-18 NOTE — Progress Notes (Signed)
    SUBJECTIVE:   CHIEF COMPLAINT / HPI:   [name] is a 65 yo M who presents for the below.   Right arm pain Fall from ladder 11/23. Fell on to right and back. Went to ED at the time of the fall but most recently 12/2 for right arm pain. It starts at his shoulder and radiates into his elbow. He is losing sleep at night because of it. He has tried lidocaine gel, lidocaine patches and a muscle rub without relief. He has an appointment with the neurosurgeon for his back this Wednesday but cannot wait that long for pain relief.   PERTINENT  PMH / PSH: T2DM, conductive hearing loss, chronic right shoulder pain  OBJECTIVE:   BP (!) 163/97   Pulse 68   Temp 98.5 F (36.9 C) (Oral)   Wt 189 lb (85.7 kg)   SpO2 97%   BMI 24.27 kg/m    General: Appears well, no acute distress. Age appropriate. Cardiac: RRR, normal heart sounds, no murmurs Respiratory: CTAB, normal effort Right shoulder exam No swelling, ecchymoses.  No gross deformity. No TTP. FROM. Negative [place], [place]. Strength 5/5 with empty can and resisted internal/external rotation. NV intact distally. Extremities: No edema or cyanosis. Skin: Warm and dry, no rashes noted Neuro: alert and oriented, no focal deficits Psych: normal affect  ASSESSMENT/PLAN:   Right arm pain Hx of fall 12 days prior. Hx of right chronic shoulder pain. Described as pain and tingling. Sought care in ED, visit reviewed. Right arm Xray reviewed by me and is negative for fracture and soft tissue injury. Lumbar T12 fracture present. Here today with continue pain seeking pain relief. Normal shoulder exam. Has tried several topical cream and lidocaine patches without resolution. Has appointment with neurosurgeon Wednesday. Advised patient to keep follow up and return as needed. Pain appears to be neurogenic. Will try gabapentin.  - gabapentin (NEURONTIN) 100 MG capsule; Take 1 capsule (100 mg total) by mouth 3 (three) times daily.  Dispense: 90  capsule; Refill: 3  2. Elevated blood pressure reading in office without diagnosis of hypertension HPI with right arm pain likely contributory. He has also taken pain medication that was not prescribed to him, could be contributory as well. Follow up in 2 weeks for BP recheck.   3. Illiterate He is unable to read or write. I explained and used teach back to close gaps.   Gerlene Fee, [place]

## 2021-08-18 NOTE — Patient Instructions (Addendum)
Thank you for coming in today. I have prescribed gabapentin for pain relief which seems to be more nerve pain that muscle pain. Follow up with the neurosurgery office Wednesday and have arm pain reassessed then.  Follow up in 2 weeks for a blood pressure recheck.   Dr. Janus Molder

## 2021-08-20 ENCOUNTER — Telehealth: Payer: Self-pay | Admitting: Family Medicine

## 2021-08-20 NOTE — Telephone Encounter (Signed)
Patient walked in stating he just left the back specialist, and that doctor referred him to a orthopedic for his arm, back is fine don't have to have surgery but doctor couldn't do anything about his arm and told him to contact his primary care doctor for pain medicine because, the tylenol isn't working and the pain cream isn't working. Patient is requesting pain medicine to be called in. Please advise. Thanks!

## 2021-08-21 DIAGNOSIS — Z550 Illiteracy and low-level literacy: Secondary | ICD-10-CM | POA: Insufficient documentation

## 2021-08-25 ENCOUNTER — Other Ambulatory Visit: Payer: Self-pay | Admitting: Family Medicine

## 2021-08-25 ENCOUNTER — Ambulatory Visit: Payer: Medicare Other

## 2021-08-25 DIAGNOSIS — E119 Type 2 diabetes mellitus without complications: Secondary | ICD-10-CM

## 2021-08-28 ENCOUNTER — Other Ambulatory Visit: Payer: Self-pay

## 2021-08-28 ENCOUNTER — Other Ambulatory Visit: Payer: Self-pay | Admitting: Family Medicine

## 2021-08-28 ENCOUNTER — Emergency Department (HOSPITAL_COMMUNITY)
Admission: EM | Admit: 2021-08-28 | Discharge: 2021-08-28 | Disposition: A | Discharge status: Home / Self Care | Payer: Medicare Other | Attending: Student | Admitting: Student

## 2021-08-28 DIAGNOSIS — Z7984 Long term (current) use of oral hypoglycemic drugs: Secondary | ICD-10-CM | POA: Diagnosis not present

## 2021-08-28 DIAGNOSIS — K6289 Other specified diseases of anus and rectum: Secondary | ICD-10-CM | POA: Diagnosis present

## 2021-08-28 DIAGNOSIS — F17210 Nicotine dependence, cigarettes, uncomplicated: Secondary | ICD-10-CM | POA: Diagnosis not present

## 2021-08-28 DIAGNOSIS — E119 Type 2 diabetes mellitus without complications: Secondary | ICD-10-CM

## 2021-08-28 DIAGNOSIS — K610 Anal abscess: Secondary | ICD-10-CM | POA: Insufficient documentation

## 2021-08-28 MED ORDER — RECTICARE ADVANCED 5-0.25-17-39 % EX CREA: 1.0000 g | TOPICAL_CREAM | Freq: Two times a day (BID) | CUTANEOUS | 0 refills | Status: DC | PRN

## 2021-08-28 MED ORDER — LIDOCAINE-EPINEPHRINE (PF) 2 %-1:200000 IJ SOLN
10.0000 mL | Freq: Once | INTRAMUSCULAR | Status: DC
  Filled 2021-08-28: qty 20

## 2021-08-28 MED ORDER — DOXYCYCLINE HYCLATE 100 MG PO TABS
100.0000 mg | ORAL_TABLET | Freq: Once | ORAL | Status: AC
  Administered 2021-08-28: 100 mg via ORAL
  Filled 2021-08-28: qty 1

## 2021-08-28 MED ORDER — ACETAMINOPHEN 325 MG PO TABS
650.0000 mg | ORAL_TABLET | Freq: Once | ORAL | Status: AC
  Administered 2021-08-28: 650 mg via ORAL
  Filled 2021-08-28: qty 2

## 2021-08-28 MED ORDER — DOXYCYCLINE HYCLATE 100 MG PO CAPS: 100.0000 mg | ORAL_CAPSULE | Freq: Two times a day (BID) | ORAL | 0 refills | Status: AC

## 2021-08-28 MED ORDER — RECTICARE ADVANCED 5-0.25-17-39 % EX CREA: 1.0000 g | TOPICAL_CREAM | Freq: Two times a day (BID) | CUTANEOUS | 0 refills | Status: AC | PRN

## 2021-08-28 NOTE — ED Notes (Signed)
Placed dry dressing on incision

## 2021-08-28 NOTE — ED Provider Notes (Signed)
[facility] EMERGENCY DEPARTMENT Provider Note   CSN: [id number] Arrival date & time: 08/28/21  0715     History Chief Complaint  Patient presents with   Rectal Pain    Hemorrhoids, per pt    [name] is a 65 y.o. male.  HPI Patient is a 65 year old male with a past medical history significant for DM2, HLD, hemorrhoids  Patient is presented to the ER today with complaints of anal pain.  He states that he has a history of hemorrhoids and feels that this is causing his pain today.  States that his pain has been constant and achy and worse with touch and defecation has been ongoing for 4 days.  States that his remote history of hemorrhoidectomy 25 years ago.  He states that he has issues with his ongoing hemorrhoids has not seen a colorectal surgeon since his surgery.  Denies any fevers chills nausea vomiting diarrhea.     Past Medical History:  Diagnosis Date   Diabetes mellitus without complication ([place])    Hyperlipidemia     Patient Active Problem List   Diagnosis Date Noted   Illiterate 08/21/2021   Weight loss, unintentional 01/20/2021   Acute exacerbation of chronic low back pain 08/12/2020   Elevated blood pressure reading in office without diagnosis of hypertension 07/29/2020   Hav (hallux abducto valgus), unspecified laterality 07/23/2020   Health care maintenance 04/10/2020   Hypercalcemia 01/24/2020   Tobacco dependence 01/23/2020   Conductive hearing loss, bilateral 10/23/2019   Left shoulder pain 03/06/2019   Chronic right shoulder pain 02/17/2019   Cough 08/31/2018   Arthritis 08/31/2018   Hyperlipidemia 06/07/2018   Type 2 diabetes mellitus without complication ([place]) 00/17/4944   Constipation 06/07/2018    No past surgical history on file.     Family History  Problem Relation Age of Onset   Diabetes Mother    Diabetes Brother    Colon polyps Neg Hx    Esophageal cancer Neg Hx    Rectal cancer Neg Hx    Stomach  cancer Neg Hx     Social History   Tobacco Use   Smoking status: Every Day    Packs/day: 0.50    Years: 25.00    Pack years: 12.50    Types: Cigarettes   Smokeless tobacco: Current  Substance Use Topics   Alcohol use: Never   Drug use: Never    Home Medications Prior to Admission medications   Medication Sig Start Date End Date Taking? Authorizing Provider  doxycycline (VIBRAMYCIN) 100 MG capsule Take 1 capsule (100 mg total) by mouth 2 (two) times daily for 7 days. 08/28/21 09/04/21 Yes Milika Ventress, Kathleene Hazel, PA  atorvastatin (LIPITOR) 20 MG tablet Take 1 tablet by mouth once daily 04/24/21   Carollee Leitz, MD  Blood Pressure KIT Check blood pressure 2-3 time a week 07/15/21   Carollee Leitz, MD  diclofenac Sodium (VOLTAREN) 1 % GEL Apply 4 g topically 4 (four) times daily. 08/15/21   Deno Etienne, DO  docusate sodium (COLACE) 100 MG capsule Take 1 capsule (100 mg total) by mouth every 12 (twelve) hours. 06/26/21   Melynda Ripple, MD  gabapentin (NEURONTIN) 100 MG capsule Take 1 capsule (100 mg total) by mouth 3 (three) times daily. 08/18/21   Autry-Lott, Naaman Plummer, DO  glucose blood (IGLUCOSE TEST STRIPS) test strip Use as instructed 06/28/18   Nuala Alpha, MD  glycerin adult 2 g suppository Place 1 suppository rectally once as needed (constipation). 06/26/21  Melynda Ripple, MD  Lido-PE-Mineral Oil-Petrolatum [facility] ADVANCED) 5-0.25-17-39 % CREA Apply 1 g topically 2 (two) times daily as needed for up to 10 days (pain). 08/28/21 09/07/21  Tedd Sias, PA  metFORMIN (GLUCOPHAGE) 500 MG tablet TAKE 1 TABLET BY MOUTH TWICE DAILY WITH A MEAL 08/12/21   Carollee Leitz, MD  polyethylene glycol powder (GLYCOLAX/MIRALAX) 17 GM/SCOOP powder Take 17 g by mouth daily. 06/26/21   Melynda Ripple, MD    Allergies    Patient has no known allergies.  Review of Systems   Review of Systems  Constitutional:  Negative for chills and fever.  HENT:  Negative for congestion.   Respiratory:   Negative for shortness of breath.   Cardiovascular:  Negative for chest pain.  Gastrointestinal:  Negative for abdominal pain.  Genitourinary:        Anal pain, hemorrhoid  Musculoskeletal:  Negative for neck pain.   Physical Exam Updated Vital Signs BP (!) 153/88    Pulse (!) 54    Temp 97.9 F (36.6 C) (Oral)    Resp 16    Ht 6' 2"  (1.88 m)    Wt 85.7 kg    SpO2 97%    BMI 24.27 kg/m   Physical Exam Vitals and nursing note reviewed.  Constitutional:      General: He is not in acute distress.    Appearance: Normal appearance. He is not ill-appearing.  HENT:     Head: Normocephalic and atraumatic.     Mouth/Throat:     Mouth: Mucous membranes are moist.  Eyes:     General: No scleral icterus.       Right eye: No discharge.        Left eye: No discharge.     Conjunctiva/sclera: Conjunctivae normal.  Pulmonary:     Effort: Pulmonary effort is normal.     Breath sounds: No stridor.  Abdominal:     Tenderness: There is no abdominal tenderness. There is no guarding or rebound.  Genitourinary:    Comments: ER tech present for initial exam.  Attending physician present during reevaluation.   There is a fluctuant small perianal abscess present at the 3 o'clock position is approximately 5 mm away from anal mucosa border.  Hemorrhoids present on exam.  Nonthrombosed Neurological:     Mental Status: He is alert and oriented to person, place, and time. Mental status is at baseline.    ED Results / Procedures / Treatments   Labs (all labs ordered are listed, but only abnormal results are displayed) Labs Reviewed - No data to display  EKG None  Radiology No results found.  Procedures .Marland KitchenIncision and Drainage  Date/Time: 08/28/2021 4:21 PM Performed by: Tedd Sias, PA Authorized by: Tedd Sias, PA   Consent:    Consent obtained:  Verbal   Consent given by:  Patient   Risks discussed:  Bleeding, incomplete drainage, pain and damage to other organs    Alternatives discussed:  No treatment Universal protocol:    Procedure explained and questions answered to patient or proxy's satisfaction: yes     Relevant documents present and verified: yes     Test results available : yes     Imaging studies available: yes     Required blood products, implants, devices, and special equipment available: yes     Site/side marked: yes     Immediately prior to procedure, a time out was called: yes     Patient identity confirmed:  Verbally with  patient Location:    Type:  Abscess   Size:  2 cm   Location:  Anogenital   Anogenital location:  Perianal Pre-procedure details:    Skin preparation:  Betadine Anesthesia:    Anesthesia method:  Local infiltration   Local anesthetic:  Lidocaine 2% WITH epi Procedure type:    Complexity:  Simple Procedure details:    Incision types:  Single straight   Incision depth:  Subcutaneous   Wound management:  Probed and deloculated, irrigated with saline and extensive cleaning   Drainage:  Purulent   Drainage amount:  Scant Post-procedure details:    Procedure completion:  Tolerated well, no immediate complications Comments:     2 cm in diameter perianal abscess incised and drained at bedside.  Local lidocaine infiltration was used.  Patient tolerated procedure well.   Medications Ordered in ED Medications  acetaminophen (TYLENOL) tablet 650 mg (650 mg Oral Given 08/28/21 0734)  doxycycline (VIBRA-TABS) tablet 100 mg (100 mg Oral Given 08/28/21 1046)    ED Course  I have reviewed the triage vital signs and the nursing notes.  Pertinent labs & imaging results that were available during my care of the patient were reviewed by me and considered in my medical decision making (see chart for details).    MDM Rules/Calculators/A&P                          Patient is 65 year old male with past medical history significant for hemorrhoidectomy presented to the emergency room today with pain in the anal area he is  concerned about a hemorrhoid flare.  My examination there is palpable fluctuance consistent with a perianal abscess.  I discussed treatment options with the patient incision and drainage versus antibiotics and watchful waiting.  He is in agreement with my recommendation to incise and drain.  Patient tolerated procedure well.  Incision yielded scant purulent fluid  He has a follow-up appointment with his primary care provider tomorrow.  Recommended sitz baths, RectiCare, doxycycline antibiotic which I prescribed, Tylenol and warm compresses. Patient was seen by my attending physician as well who agrees with treatment plan and recommendations.  Return precautions given.  Final Clinical Impression(s) / ED Diagnoses Final diagnoses:  Perianal abscess    Rx / DC Orders ED Discharge Orders          Ordered    Lido-PE-Mineral Oil-Petrolatum (RECTICARE ADVANCED) 5-0.25-17-39 % CREA  2 times daily PRN,   Status:  Discontinued        08/28/21 1038    Lido-PE-Mineral Oil-Petrolatum (RECTICARE ADVANCED) 5-0.25-17-39 % CREA  2 times daily PRN        08/28/21 1118    doxycycline (VIBRAMYCIN) 100 MG capsule  2 times daily        08/28/21 [address] [place], [place] 08/30/21 0910    Teressa Lower, MD 09/02/21 2336

## 2021-08-28 NOTE — ED Triage Notes (Signed)
Patient arrives with complaints of rectum pain related to a hemorrhoid flare. Patient states that he has been dealing with the pain for 4 days.He had tried some home remedies with minimal relief.   States he had surgery to get his hemorrhoids removed 25 years ago and now they have returned. He is scheduled for a doctors appt tomorrow as well.

## 2021-08-28 NOTE — Discharge Instructions (Signed)
Please do sitz baths where you fill the bathtub to with a small amount of warm water enough to submerge your buttocks.  Please do this 4 times a day.  Follow-up with your primary care provider tomorrow for a wound check from the incision today and to discuss your symptoms.  Take the antibiotics that I prescribed you as well as the medicine that I have given you to applied to your anus twice daily.

## 2021-08-28 NOTE — Progress Notes (Signed)
° ° °  SUBJECTIVE:   CHIEF COMPLAINT / HPI:   Follow-up after perianal abscess: Patient went to the emergency department yesterday and had a perianal abscess drained.  He was placed on doxycycline for 7 days.  He presents today to make sure the lesion appears improved.  He states he has a little bit of discomfort in that area but denies other concerns.  He states he is taking the doxycycline.  Right arm pain: [place] from a ladder on 11/23.  He went to the emergency department for this but states that he continues to have pain in that area.  He has an upcoming appointment with a physician "at his neurosurgeons office" who plans to do cervical x-rays to evaluate if his pain is coming from his neck.  This is set up for about 10 days from now.  He request a right shoulder injection today.  PERTINENT  PMH / PSH: None relevant  OBJECTIVE:   BP (!) 178/93    Pulse 72    Wt 184 lb 9.6 oz (83.7 kg)    SpO2 100%    BMI 23.70 kg/m    BP recheck 152/100  General: NAD, pleasant, able to participate in exam Respiratory: No respiratory distress MSK: Right shoulder with no pain to palpation, no obvious erythema or swelling.  He does have normal range of motion.  He has some pain with internal and external rotation as well as empty can testing.  Neurovascularly intact. GU: Small incision present left of the anus with gauze in place, he has a mild amount of discomfort when palpating the area with no obvious abscess palpated underneath.  No erythema spreading from the region and no purulence expressed from the incision site.  Chaperone: Desiree Neuro: alert, no obvious focal deficits Psych: Normal affect and mood  ASSESSMENT/PLAN:    Follow-up-perianal abscess: Recommended continuing the doxycycline.  It appears well-healing at this time with no obvious purulent drainage.  Discussed if it worsens or does not improve he should follow back up.  If he develops any fevers or other systemic symptoms to follow-up  sooner.  Right shoulder pain: Discussed with the patient that in the setting of a current infection while he is on doxycycline I strongly recommend against a shoulder injection.  This is further reiterated by the fact that he is seeing "someone in his neurosurgeons office" in 10 days for evaluation of his neck to see if this is the cause of his symptoms.  I would not want to perform an injection in the shoulder if they plan to perform any procedures that this may limit.  I recommended he continue with topicals as well as Tylenol.  Follow-up as needed.  Elevated blood pressure reading: Blood pressure elevated 173/93, 152/100 on recheck.  Patient does not take any antihypertensives.  He does not have a blood pressure cuff at home.  Recommended that he follow-up in early next week around Monday or Tuesday for blood pressure recheck.  If it is elevated at that time we may want to consider antihypertensive medication.  Lurline Del, [place]

## 2021-08-29 ENCOUNTER — Ambulatory Visit (INDEPENDENT_AMBULATORY_CARE_PROVIDER_SITE_OTHER): Payer: Medicare Other | Admitting: Family Medicine

## 2021-08-29 ENCOUNTER — Other Ambulatory Visit: Payer: Self-pay

## 2021-08-29 VITALS — BP 178/93 | HR 72 | Wt 184.6 lb

## 2021-08-29 DIAGNOSIS — R030 Elevated blood-pressure reading, without diagnosis of hypertension: Secondary | ICD-10-CM

## 2021-08-29 DIAGNOSIS — M25511 Pain in right shoulder: Secondary | ICD-10-CM

## 2021-08-29 DIAGNOSIS — K610 Anal abscess: Secondary | ICD-10-CM | POA: Diagnosis not present

## 2021-08-29 NOTE — Patient Instructions (Signed)
Your abscess seems to be doing well.  Continue taking your antibiotic.  If you develop any fevers or worsening symptoms please let us know.  For your shoulder pain I think it would be a bad idea to do an injection today since you have an active infection and this could worsen the infection.  I do think you should continue to follow-up with your specialist on the 27th.  In the meantime you can try some topicals such as diclofenac gel or lidocaine patches and then Tylenol in addition to this.  You can also try ice and heat.  Once we get the full work-up completed with your specialist we can determine next steps.

## 2021-09-01 ENCOUNTER — Telehealth: Payer: Self-pay | Admitting: Family Medicine

## 2021-09-01 ENCOUNTER — Other Ambulatory Visit: Payer: Self-pay | Admitting: Family Medicine

## 2021-09-01 ENCOUNTER — Ambulatory Visit: Payer: Medicare Other | Admitting: Family Medicine

## 2021-09-01 MED ORDER — POLYETHYLENE GLYCOL 3350 17 GM/SCOOP PO POWD: 17.0000 g | Freq: Every day | ORAL | 1 refills | Status: DC | PRN

## 2021-09-01 NOTE — Telephone Encounter (Signed)
Patient came in office today needs refill of his medicine Glycolax sent to his pharmacy which is [facility]  [address] in [place]. Any questions call patient [phone]

## 2021-09-02 ENCOUNTER — Other Ambulatory Visit: Payer: Self-pay | Admitting: Family Medicine

## 2021-09-02 DIAGNOSIS — E119 Type 2 diabetes mellitus without complications: Secondary | ICD-10-CM

## 2021-09-05 ENCOUNTER — Ambulatory Visit (INDEPENDENT_AMBULATORY_CARE_PROVIDER_SITE_OTHER): Payer: Medicare Other

## 2021-09-05 ENCOUNTER — Encounter: Payer: Self-pay | Admitting: Student

## 2021-09-05 ENCOUNTER — Ambulatory Visit (INDEPENDENT_AMBULATORY_CARE_PROVIDER_SITE_OTHER): Payer: Medicare Other | Admitting: Student

## 2021-09-05 ENCOUNTER — Other Ambulatory Visit: Payer: Self-pay

## 2021-09-05 VITALS — BP 152/86 | HR 74

## 2021-09-05 DIAGNOSIS — I10 Essential (primary) hypertension: Secondary | ICD-10-CM

## 2021-09-05 DIAGNOSIS — Z0130 Encounter for examination of blood pressure without abnormal findings: Secondary | ICD-10-CM

## 2021-09-05 MED ORDER — LOSARTAN POTASSIUM 25 MG PO TABS: 25.0000 mg | ORAL_TABLET | Freq: Every day | ORAL | 0 refills | Status: DC

## 2021-09-05 NOTE — Patient Instructions (Signed)
It was great to see you! Thank you for allowing me to participate in your care!  I recommend that you always bring your medications to each appointment as this makes it easy to ensure you are on the correct medications and helps [place] not miss when refills are needed.  Our plans for today:  -I have prescribed you a blood pressure medication called losartan.  Please take this medication once a day at bedtime. -Please return in 2 weeks for a visit with a provider to have your blood pressure rechecked and to have labs drawn.   Take care and seek immediate care sooner if you develop any concerns.   Dr. Precious Gilding, DO [facility]

## 2021-09-05 NOTE — Progress Notes (Signed)
° ° °  SUBJECTIVE:   CHIEF COMPLAINT / HPI:   Patient was seen on 08/29/2021 and was found to have an elevated blood pressure of 178/93.  BP today remains elevated at 152/86.  He denies feeling dizzy, chest pain, or shortness of breath today. Patient agrees to start a blood pressure medication at this time.  PERTINENT  PMH / PSH: Type 2 diabetes, HLD  OBJECTIVE:   BP (!) 152/86    Pulse 74    SpO2 98%    General: NAD, pleasant, able to participate in exam Cardiac: RRR, no murmurs. Respiratory: CTAB, normal effort, No wheezes, rales or rhonchi Neuro: alert, no obvious focal deficits Psych: Normal affect and mood  ASSESSMENT/PLAN:   HTN Patient's blood pressure remains elevated today at 152/86.  As he has diabetes, we will start him on losartan. -Losartan 25 mg daily -Return in 2 weeks for blood pressure check and BMP     Dr. Precious Gilding, [place]

## 2021-09-05 NOTE — Progress Notes (Signed)
Patient here today for BP check.      Last BP was on 08/29/2021 and was 178/93.  BP today is 152/86 with a pulse of 74.    Checked BP in left arm with regular adult cuff.    Symptoms present: none.   Spoke with preceptor regarding patient. As BP continues to be elevated, patient needs provider evaluation. Scheduled same day appointment with Dr. Ronnald Ramp to discuss BP and medication.   Talbot Grumbling, RN

## 2021-09-22 ENCOUNTER — Telehealth: Payer: Self-pay | Admitting: Student

## 2021-09-22 ENCOUNTER — Encounter: Payer: Self-pay | Admitting: Student

## 2021-09-22 ENCOUNTER — Other Ambulatory Visit: Payer: Self-pay

## 2021-09-22 ENCOUNTER — Ambulatory Visit (INDEPENDENT_AMBULATORY_CARE_PROVIDER_SITE_OTHER): Payer: Commercial Managed Care - HMO | Admitting: Student

## 2021-09-22 VITALS — BP 132/80 | HR 70 | Ht 74.0 in | Wt 194.2 lb

## 2021-09-22 DIAGNOSIS — I10 Essential (primary) hypertension: Secondary | ICD-10-CM

## 2021-09-22 MED ORDER — LOSARTAN POTASSIUM 25 MG PO TABS: 25.0000 mg | ORAL_TABLET | Freq: Every day | ORAL | 0 refills | Status: DC

## 2021-09-22 MED ORDER — NICOTINE 21 MG/24HR TD PT24: 21.0000 mg | MEDICATED_PATCH | Freq: Every day | TRANSDERMAL | 0 refills | Status: DC

## 2021-09-22 MED ORDER — BACLOFEN 10 MG PO TABS: 10.0000 mg | ORAL_TABLET | Freq: Three times a day (TID) | ORAL | 0 refills | Status: DC

## 2021-09-22 NOTE — Patient Instructions (Signed)
It was great to see you! Thank you for allowing me to participate in your care!  I recommend that you always bring your medications to each appointment as this makes it easy to ensure you are on the correct medications and helps [place] not miss when refills are needed.  Our plans for today:  - I have sent a prescription for a week supply of baclofen (a muscle relaxer to your pharmacy). You can take one tablet three times a day.  -I sent in a prescription for nicotine patches. Apply one patch per day to help you stop smoking. -We are checking your electrolytes today. If the labs are normal I will send you a letter. If they are abnormal, I will call you. -Please return within 4 weeks for a follow up visit  Take care and seek immediate care sooner if you develop any concerns.   Dr. Precious Gilding, DO [facility]

## 2021-09-22 NOTE — Progress Notes (Signed)
° ° °  SUBJECTIVE:   CHIEF COMPLAINT / HPI: Follow-up for hypertension, low back pain  Low back pain States he has had low back pain in the past and was given muscle relaxer which helped. He has been taking a friends muscle relaxer which has helped with his current back pain. The pain is worse with movement, bending over and standing up. He would like to try this again.   On chart review, CT of lumbar spine from 08/06/2021 showed mild degenerative lumbar spondylosis with multilevel disc disease most significant at L4-5.  HTN Patient was newly diagnosed with hypertension at his last visit about 2 weeks ago and started on losartan 25 mg daily at bedtime.  He last took his blood pressure medication last night and has been taking it regularly. No chest pain. Has shortness of breath on occasion he thinks from smoking cigarettes.   Tobacco use Smoke about 10 cigarettes or more/ day. Would like to get nicotine patches to attempt to stop.    PERTINENT  PMH / PSH: Type 2 diabetes, arthritis, hyperlipidemia, HTN  OBJECTIVE:   BP 132/80    Pulse 70    Ht 6\' 2"  (1.88 m)    Wt 194 lb 3.2 oz (88.1 kg)    SpO2 100%    BMI 24.93 kg/m    General: NAD, pleasant, able to participate in exam Cardiac: RRR, no murmurs. Respiratory: CTAB, normal effort, No wheezes, rales or rhonchi MSK: No tenderness to palpation at and around lumbar spine, patient expresses mild pain with standing and flexion of spine but has good range of motion with bending forward. Neuro: alert, no obvious focal deficits Psych: Normal affect and mood  ASSESSMENT/PLAN:   Low back pain -Can use over-the-counter Tylenol as needed for pain -Baclofen 10 mg 3 times daily for 1 week -Follow-up within 4 weeks or sooner if needed  HTN BP of 155/91 in office today with repeat of 132/80. -Continue losartan 25 mg daily at bedtime -BMP today  Tobacco use -Nicotine patch 21 mg daily -Return within 4 weeks for follow-up, consider  decreasing to 14 mg daily if appropriate   Dr. Precious Gilding, [place]

## 2021-09-22 NOTE — Telephone Encounter (Signed)
Patient was seen this morning.  Went to pharmacy to pick up his meds the only one he got was baclofen 10mg .  He didn't get the two others one should be a patch for his arm and the other is refil for blood pressure medicine.  He didn't get these.  Can you please send request to his pharmacy for these two that he didn't get.  Pharmacy is [facility].  Any questions call patient.  [phone]

## 2021-09-23 ENCOUNTER — Encounter: Payer: Self-pay | Admitting: Student

## 2021-09-23 ENCOUNTER — Other Ambulatory Visit: Payer: Self-pay | Admitting: Family Medicine

## 2021-09-23 DIAGNOSIS — I10 Essential (primary) hypertension: Secondary | ICD-10-CM

## 2021-09-23 LAB — BASIC METABOLIC PANEL
BUN/Creatinine Ratio: 13 (ref 10–24)
BUN: 12 mg/dL (ref 8–27)
CO2: 23 mmol/L (ref 20–29)
Calcium: 9.9 mg/dL (ref 8.6–10.2)
Chloride: 104 mmol/L (ref 96–106)
Creatinine, Ser: 0.91 mg/dL (ref 0.76–1.27)
Glucose: 107 mg/dL — ABNORMAL HIGH (ref 70–99)
Potassium: 4.8 mmol/L (ref 3.5–5.2)
Sodium: 136 mmol/L (ref 134–144)
eGFR: 93 mL/min/{1.73_m2} (ref >59)

## 2021-09-23 MED ORDER — NICOTINE 21 MG/24HR TD PT24
21.0000 mg | MEDICATED_PATCH | Freq: Every day | TRANSDERMAL | 0 refills | Status: DC
Start: 2021-09-23 — End: 2021-10-30

## 2021-09-23 MED ORDER — LOSARTAN POTASSIUM 25 MG PO TABS: 25.0000 mg | ORAL_TABLET | Freq: Every day | ORAL | 0 refills | Status: DC

## 2021-09-23 NOTE — Telephone Encounter (Signed)
Resent

## 2021-09-23 NOTE — Progress Notes (Signed)
Medications resent for Cozaar and Nicoderm patch  Carollee Leitz, MD Family Medicine Residency

## 2021-09-23 NOTE — Progress Notes (Signed)
Letter sent with normal BMP results

## 2021-10-02 ENCOUNTER — Other Ambulatory Visit (HOSPITAL_COMMUNITY): Payer: Self-pay

## 2021-10-02 ENCOUNTER — Telehealth: Payer: Self-pay

## 2021-10-02 NOTE — Telephone Encounter (Signed)
A Prior Authorization was initiated for this patients NICODERM 21MG  PATCHES through CoverMyMeds.   attached chart notes from 09/22/21  Key: [id number]

## 2021-10-03 ENCOUNTER — Ambulatory Visit (INDEPENDENT_AMBULATORY_CARE_PROVIDER_SITE_OTHER): Payer: Medicare Other | Admitting: Family Medicine

## 2021-10-03 ENCOUNTER — Other Ambulatory Visit: Payer: Self-pay

## 2021-10-03 VITALS — BP 120/82 | HR 82 | Ht 74.0 in | Wt 187.4 lb

## 2021-10-03 DIAGNOSIS — M545 Low back pain, unspecified: Secondary | ICD-10-CM | POA: Insufficient documentation

## 2021-10-03 DIAGNOSIS — K5900 Constipation, unspecified: Secondary | ICD-10-CM

## 2021-10-03 MED ORDER — BACLOFEN 10 MG PO TABS: 10.0000 mg | ORAL_TABLET | Freq: Three times a day (TID) | ORAL | 0 refills | Status: DC

## 2021-10-03 MED ORDER — LACTULOSE 10 GM/15ML PO SOLN: ORAL | 0 refills | Status: DC

## 2021-10-03 NOTE — Patient Instructions (Signed)
It was great seeing you today!  Today we discussed your back pain, it seems to be due to your recent injury. I am glad that it is starting to improve. I have prescribed a short course of a muscle relaxer that you can take 3 times a day. Please apply ice and heat as you have a muscle strain in this area.  If the pain continues for more than 2 weeks then please go see your neurosurgeon for further evaluation.   Please follow up at your next scheduled appointment, if anything arises between now and then, please don't hesitate to contact our office.   Thank you for allowing [place] to be a part of your medical care!  Thank you, Dr. Larae Grooms

## 2021-10-03 NOTE — Progress Notes (Signed)
° ° °  SUBJECTIVE:   CHIEF COMPLAINT / HPI:   Patient with extensive history of lumbar abnormalities presents for back pain. Endorses recent injury, picked up a pressure washer about 5 days ago and pain has been ongoing since but has improved since the injury. Endorsing low back pain and radiates slightly to the right side. Aggravating factors certain positions such as moving from laying to sitting but felt a lot better when he woke up this morning. Relieving factors include the muscle relaxer. Denies any numbness, tingling or other associated symptoms. Denies any history of cancer, dysuria and groin paresthesia.  Recent fall 2 months ago, went to ED and recommended to see neurosurgery which he was evaluated by and they said that he is doing well and to see him if he has any further issues. At this time, he also saw a back specialist. Did not have ongoing back pain prior to this fall, would have it occasionally and muscle relaxers helped the pain. When he had the fall 2 months ago, he fell off a ladder and then needed muscle relaxers. He went to the ED and imaging obtained notable for mild degenerative lumbar spondylosis, L4-L5 moderately severe to severe spinal and bilateral lateral recess along with stenosis and mild right foraminal narrowing.   OBJECTIVE:   BP 120/82    Pulse 82    Ht 6\' 2"  (1.88 m)    Wt 187 lb 6 oz (85 kg)    SpO2 99%    BMI 24.06 kg/m   General: Patient well-appearing, in no acute distress. CV: RRR, no murmurs or gallops auscultated  Resp: CTAB, no wheezing or rales noted MSK: no gross deformity noted, no tenderness along deep palpation of L1-L5, no paraspinal tenderness, no skin changes resembling edema or erythema, negative straight leg raise, gross sensation intact, full range of motion  Psych: mood appropriate   ASSESSMENT/PLAN:   Acute midline low back pain -acute changes likely secondary to recent injury causing muscle strain in the setting of chronic and ongoing  lumbar history, low concern for sciatica or neuropathy although history of diabetes, reassuringly no red flag symptoms  -Baclofen 7 day supply provided -application of heat and ice along with rest -follow up in 2 weeks if pain persists then consider further evaluation by neurosurgery at this time    Donney Dice, [place]

## 2021-10-03 NOTE — Assessment & Plan Note (Signed)
-  acute changes likely secondary to recent injury causing muscle strain in the setting of chronic and ongoing lumbar history, low concern for sciatica or neuropathy although history of diabetes, reassuringly no red flag symptoms  -Baclofen 7 day supply provided -application of heat and ice along with rest -follow up in 2 weeks if pain persists then consider further evaluation by neurosurgery at this time

## 2021-10-06 NOTE — Telephone Encounter (Signed)
PA for pt's Nicotine 21MG /24HR 24 hr patches was denied by [employer] via CoverMyMeds (key: [id number]).   Reason: The requested medication is not properly listed with the Food and Drug Administration (FDA) and does not meet regulatory requirements that would constitute a Part D-eligible drug. Therefore, the medication is not covered under your Part D prescription drug plan

## 2021-10-06 NOTE — Telephone Encounter (Signed)
There are prescriptions on Teams that the patient can take to [facility]. Will need to print from there and give to patient to bring to pharmacy.  Thanks Carollee Leitz, MD Family Medicine Residency

## 2021-10-10 ENCOUNTER — Telehealth: Payer: Self-pay | Admitting: Family Medicine

## 2021-10-10 NOTE — Telephone Encounter (Signed)
Patient came in stating that his hemorrhoids are flaring up again, and wants to know if he can get a refill on his antibiotics if possible.

## 2021-10-11 ENCOUNTER — Emergency Department (HOSPITAL_COMMUNITY)
Admission: EM | Admit: 2021-10-11 | Discharge: 2021-10-11 | Disposition: A | Discharge status: Home / Self Care | Payer: Medicare Other | Attending: Emergency Medicine | Admitting: Emergency Medicine

## 2021-10-11 DIAGNOSIS — L0231 Cutaneous abscess of buttock: Secondary | ICD-10-CM | POA: Diagnosis not present

## 2021-10-11 DIAGNOSIS — E119 Type 2 diabetes mellitus without complications: Secondary | ICD-10-CM | POA: Insufficient documentation

## 2021-10-11 MED ORDER — SENNOSIDES-DOCUSATE SODIUM 8.6-50 MG PO TABS: 1.0000 | ORAL_TABLET | Freq: Every day | ORAL | 0 refills | Status: DC

## 2021-10-11 MED ORDER — SULFAMETHOXAZOLE-TRIMETHOPRIM 800-160 MG PO TABS
1.0000 | ORAL_TABLET | Freq: Two times a day (BID) | ORAL | 0 refills | Status: AC
Start: 2021-10-11 — End: 2021-10-18

## 2021-10-11 MED ORDER — SULFAMETHOXAZOLE-TRIMETHOPRIM 800-160 MG PO TABS
1.0000 | ORAL_TABLET | Freq: Once | ORAL | Status: AC
  Administered 2021-10-11: 1 via ORAL
  Filled 2021-10-11: qty 1

## 2021-10-11 MED ORDER — CEPHALEXIN 250 MG PO CAPS
500.0000 mg | ORAL_CAPSULE | Freq: Once | ORAL | Status: AC
  Administered 2021-10-11: 500 mg via ORAL
  Filled 2021-10-11: qty 2

## 2021-10-11 MED ORDER — CEPHALEXIN 500 MG PO CAPS: 500.0000 mg | ORAL_CAPSULE | Freq: Four times a day (QID) | ORAL | 0 refills | Status: DC

## 2021-10-11 MED ORDER — POLYETHYLENE GLYCOL 3350 17 G PO PACK: 17.0000 g | PACK | Freq: Every day | ORAL | 0 refills | Status: DC

## 2021-10-11 NOTE — ED Provider Notes (Signed)
[facility] EMERGENCY DEPARTMENT Provider Note   CSN: [id number] Arrival date & time: 10/11/21  2050     History  Chief Complaint  Patient presents with   Abscess    [name] is a 66 y.o. male.  66 year old male presents today for evaluation of perianal abscess.  Reports this flared up 2 days ago.  Patient was evaluated mid December and had abscess drained in the same location.  Patient was discharged on doxycycline which she completed.  Patient reports he had incomplete resolution of the abscess.  He reports 2 days ago it flared up.  He has been doing sitz bath with improvement in size. Patient denies fever, chills.  The history is provided by the patient. No language interpreter was used.  Abscess Associated symptoms: no fever       Home Medications Prior to Admission medications   Medication Sig Start Date End Date Taking? Authorizing Provider  atorvastatin (LIPITOR) 20 MG tablet Take 1 tablet by mouth once daily 04/24/21   Carollee Leitz, MD  baclofen (LIORESAL) 10 MG tablet Take 1 tablet (10 mg total) by mouth 3 (three) times daily. 10/03/21   Donney Dice, DO  Blood Pressure KIT Check blood pressure 2-3 time a week 07/15/21   Carollee Leitz, MD  diclofenac Sodium (VOLTAREN) 1 % GEL Apply 4 g topically 4 (four) times daily. 08/15/21   Deno Etienne, DO  docusate sodium (COLACE) 100 MG capsule Take 1 capsule (100 mg total) by mouth every 12 (twelve) hours. 06/26/21   Melynda Ripple, MD  gabapentin (NEURONTIN) 100 MG capsule Take 1 capsule (100 mg total) by mouth 3 (three) times daily. 08/18/21   Autry-Lott, Naaman Plummer, DO  glucose blood (IGLUCOSE TEST STRIPS) test strip Use as instructed 06/28/18   Nuala Alpha, MD  glycerin adult 2 g suppository Place 1 suppository rectally once as needed (constipation). 06/26/21   Melynda Ripple, MD  lactulose (CONSTULOSE) 10 GM/15ML solution TAKE 15 MLS BY MOUTH DAILY AS NEEDED FOR MILD CONSTIPATION. 10/03/21   Ganta, Anupa,  DO  losartan (COZAAR) 25 MG tablet Take 1 tablet (25 mg total) by mouth at bedtime. 09/23/21   Carollee Leitz, MD  metFORMIN (GLUCOPHAGE) 500 MG tablet TAKE 1 TABLET BY MOUTH TWICE DAILY WITH A MEAL 08/12/21   Carollee Leitz, MD  nicotine (NICODERM CQ - DOSED IN MG/24 HOURS) 21 mg/24hr patch Place 1 patch (21 mg total) onto the skin daily. 09/23/21   Carollee Leitz, MD  polyethylene glycol powder (GLYCOLAX/MIRALAX) 17 GM/SCOOP powder Take 17 g by mouth daily. 06/26/21   Melynda Ripple, MD  polyethylene glycol powder (GLYCOLAX/MIRALAX) 17 GM/SCOOP powder Take 17 g by mouth daily as needed. 09/01/21   Carollee Leitz, MD      Allergies    Patient has no known allergies.    Review of Systems   Review of Systems  Constitutional:  Negative for chills and fever.  All other systems reviewed and are negative.  Physical Exam Updated Vital Signs BP (!) 147/87 (BP Location: Left Arm)    Pulse 68    Temp 97.7 F (36.5 C) (Temporal)    Resp 18    SpO2 97%  Physical Exam Vitals and nursing note reviewed. Exam conducted with a chaperone present.  Constitutional:      General: He is not in acute distress.    Appearance: Normal appearance. He is not ill-appearing.  HENT:     Head: Normocephalic and atraumatic.     Nose: Nose normal.  Eyes:     General: No scleral icterus.    Extraocular Movements: Extraocular movements intact.     Conjunctiva/sclera: Conjunctivae normal.  Cardiovascular:     Rate and Rhythm: Normal rate and regular rhythm.     Pulses: Normal pulses.     Heart sounds: Normal heart sounds.  Pulmonary:     Effort: Pulmonary effort is normal. No respiratory distress.     Breath sounds: Normal breath sounds. No wheezing or rales.  Abdominal:     General: There is no distension.     Palpations: Abdomen is soft.     Tenderness: There is no abdominal tenderness. There is no guarding.  Genitourinary:      Comments: Nurse present as chaperone.  2.5 cm abscess present at the location  above.  Mild fluctuance.  No drainage noted. Musculoskeletal:        General: No deformity. Normal range of motion.     Cervical back: Normal range of motion.  Skin:    General: Skin is warm and dry.     Findings: No rash.  Neurological:     General: No focal deficit present.     Mental Status: He is alert. Mental status is at baseline.    ED Results / Procedures / Treatments   Labs (all labs ordered are listed, but only abnormal results are displayed) Labs Reviewed - No data to display  EKG None  Radiology No results found.  Procedures Procedures    Medications Ordered in ED Medications - No data to display  ED Course/ Medical Decision Making/ A&P                           Medical Decision Making  Medical Decision Making / ED Course   This patient presents to the ED for concern of rectal abscess, this involves an extensive number of treatment options, and is a complaint that carries with it a high risk of complications and morbidity.  The differential diagnosis includes perirectal abscess, abscess with fistula formation, cellulitis  MDM: 66 year old male presents today for evaluation of perirectal abscess.  Patient had abscess at the same location drained mid December that was drained and treated with doxycycline.  Patient reports improvement but not full resolution of that episode.  Reports recurrence 2 days ago.  Has been fluctuating in size over the past 2 days improving with sitz bath.  Patient denies fever, chills or drainage from the area.  Mild fluctuance present on exam.  I&D offered but patient refuses and wants to be treated with antibiotics.  Patient treated with Keflex and Bactrim.  Patient also reports constipation.  Discussed bowel regimen and prescribed MiraLAX and Senokot.  Patient given first dose antibiotics in the emergency room.  Patient requested prescription be printed as opposed to E prescribed.  Patient is appropriate for discharge.  Discharged in  stable condition.  Patient voices understanding and is in agreement with plan. CT imaging considered for evaluation of complicated abscess with fistula formation however given patient does not have history of IBD, or Crohn's and he is without signs or symptoms of systemic infection this was deferred.  Additional history obtained: -Additional history obtained from follow-up family medicine clinic visit after last emergency room visit confirming that his abscess was improving at the time but not completely resolved. -External records from outside source obtained and reviewed including: Chart review including previous notes, labs, imaging, consultation notes   Lab Tests: -I ordered,  reviewed, and interpreted labs.   The pertinent results include:   Labs Reviewed - No data to display    EKG  EKG Interpretation  Date/Time:    Ventricular Rate:    PR Interval:    QRS Duration:   QT Interval:    QTC Calculation:   R Axis:     Text Interpretation:           Medicines ordered and prescription drug management: Meds ordered this encounter  Medications   polyethylene glycol (MIRALAX) 17 g packet    Sig: Take 17 g by mouth daily.    Dispense:  14 each    Refill:  0    Order Specific Question:   Supervising Provider    Answer:   MILLER, BRIAN [[id number]]   senna-docusate (SENOKOT-S) 8.6-50 MG tablet    Sig: Take 1 tablet by mouth daily.    Dispense:  30 tablet    Refill:  0    Order Specific Question:   Supervising Provider    Answer:   Sabra Heck, BRIAN [[id number]]   cephALEXin (KEFLEX) 500 MG capsule    Sig: Take 1 capsule (500 mg total) by mouth 4 (four) times daily.    Dispense:  20 capsule    Refill:  0    Order Specific Question:   Supervising Provider    Answer:   Sabra Heck, BRIAN [[id number]]   sulfamethoxazole-trimethoprim (BACTRIM DS) 800-160 MG tablet    Sig: Take 1 tablet by mouth 2 (two) times daily for 7 days.    Dispense:  14 tablet    Refill:  0    Order Specific Question:    Supervising Provider    Answer:   MILLER, BRIAN [[id number]]    -I have reviewed the patients home medicines and have made adjustments as needed  Reevaluation: After the interventions noted above, I reevaluated the patient and found that they have :stayed the same  Co morbidities that complicate the patient evaluation  Past Medical History:  Diagnosis Date   Diabetes mellitus without complication ([place])    Hyperlipidemia       Dispostion: Patient is appropriate for discharge.  Discharged in stable condition.  Final Clinical Impression(s) / ED Diagnoses Final diagnoses:  Abscess of buttock, right    Rx / DC Orders ED Discharge Orders          Ordered    polyethylene glycol (MIRALAX) 17 g packet  Daily        10/11/21 2203    senna-docusate (SENOKOT-S) 8.6-50 MG tablet  Daily        10/11/21 2203    cephALEXin (KEFLEX) 500 MG capsule  4 times daily        10/11/21 2203    sulfamethoxazole-trimethoprim (BACTRIM DS) 800-160 MG tablet  2 times daily        10/11/21 2203              Evlyn Courier, PA-C 10/11/21 2212    Drenda Freeze, MD 10/11/21 [phone]

## 2021-10-11 NOTE — ED Triage Notes (Signed)
Pt w abscess on bottom, states it was previously incised & drained but never fully healed, has since returned. Requesting abx, drained if necessary

## 2021-10-11 NOTE — Discharge Instructions (Addendum)
Your evaluated in the emergency room.  You do have abscess.  He do not have fever, or other signs of systemic infection.  We did discuss draining the abscess however you would like to treat this with antibiotics at this time.  If you have any worsening symptoms please return to the emergency room.  I have sent into antibiotics for you to take.  He also mention you were constipated.  I have sent in MiraLAX as well as Senokot to the pharmacy for you.  You can take up to 4 scoops of MiraLAX per day until you have a bowel movement.  Continue sitz bath.  Recommend you follow-up with your primary care provider for evaluation. You were given printed prescriptions at your request.

## 2021-10-17 NOTE — Telephone Encounter (Signed)
Patient contacted for follow/up of tobacco cessation attempt.   Since last contact patient reports he obtained nicotine patches and they "DID NOT WORK at ALL".   I offered to meet with him and he shared that he has a PCP appt on 2/13 at 1:50 with Dr. Volanda Napoleon.   He was willing to let me join that visit to discuss options and attempt to develop new plan.    I will plan to meet patient 2/13 - Thanks  Total time with patient call and documentation of interaction: 12 minutes.

## 2021-10-27 ENCOUNTER — Encounter: Payer: Self-pay | Admitting: Family Medicine

## 2021-10-27 ENCOUNTER — Ambulatory Visit (INDEPENDENT_AMBULATORY_CARE_PROVIDER_SITE_OTHER): Payer: 59 | Admitting: Family Medicine

## 2021-10-27 ENCOUNTER — Other Ambulatory Visit: Payer: Self-pay

## 2021-10-27 VITALS — BP 120/84 | HR 67 | Ht 74.0 in | Wt 194.0 lb

## 2021-10-27 DIAGNOSIS — R030 Elevated blood-pressure reading, without diagnosis of hypertension: Secondary | ICD-10-CM

## 2021-10-27 DIAGNOSIS — Z0000 Encounter for general adult medical examination without abnormal findings: Secondary | ICD-10-CM | POA: Diagnosis not present

## 2021-10-27 DIAGNOSIS — E119 Type 2 diabetes mellitus without complications: Secondary | ICD-10-CM | POA: Diagnosis not present

## 2021-10-27 DIAGNOSIS — M545 Low back pain, unspecified: Secondary | ICD-10-CM | POA: Diagnosis not present

## 2021-10-27 DIAGNOSIS — I10 Essential (primary) hypertension: Secondary | ICD-10-CM | POA: Diagnosis not present

## 2021-10-27 DIAGNOSIS — F17200 Nicotine dependence, unspecified, uncomplicated: Secondary | ICD-10-CM

## 2021-10-27 DIAGNOSIS — K5900 Constipation, unspecified: Secondary | ICD-10-CM

## 2021-10-27 LAB — POCT GLYCOSYLATED HEMOGLOBIN (HGB A1C): HbA1c, POC (controlled diabetic range): 6.3 % (ref 0.0–7.0)

## 2021-10-27 MED ORDER — LACTULOSE 10 GM/15ML PO SOLN: ORAL | 0 refills | Status: DC

## 2021-10-27 MED ORDER — BACLOFEN 10 MG PO TABS: 10.0000 mg | ORAL_TABLET | Freq: Three times a day (TID) | ORAL | 0 refills | Status: DC | PRN

## 2021-10-27 MED ORDER — LOSARTAN POTASSIUM 25 MG PO TABS: 25.0000 mg | ORAL_TABLET | Freq: Every day | ORAL | 1 refills | Status: DC

## 2021-10-27 NOTE — Assessment & Plan Note (Signed)
Normotensive Continue Losartan 25 mg daily

## 2021-10-27 NOTE — Assessment & Plan Note (Addendum)
A1c 6.3 Continue Metformin 500 mg BID Follow up in 3 months

## 2021-10-27 NOTE — Assessment & Plan Note (Signed)
Declined vaccines

## 2021-10-27 NOTE — Assessment & Plan Note (Signed)
Tried patches.  Didn't help.  Follow up with Dr Valentina Lucks to discuss other option

## 2021-10-27 NOTE — Patient Instructions (Addendum)
Thank you for coming to see me today. It was a pleasure. Today we talked about:   Call your eye doctor to get your eye appointment results sent to this clinic  Recommend Flu, Pneumonia, Shingles and Tetanus vaccine. Also COVID booster.  You should pay attention to your hemoglobin A1C.  It is a three month test about your average blood sugar. If the A1C is - <7.0 is great.  That is our goal for treating you. - Between 7.0 and 9.0 is not so good.  We would need to work to do better. - Above 9.0 is terrible.  You would really need to work with [place] to get it under control.    Today's A1C = 6.3   If you would like some assistance to help quit smoking please schedule an appointment with me to discuss the many ways that we can assist you if and when you decide you are ready to take the next step toward a smoke free lifestyle.   Please follow-up with PCP 3 month  If you have any questions or concerns, please do not hesitate to call the office at [phone].  Best,   Carollee Leitz, MD

## 2021-10-27 NOTE — Assessment & Plan Note (Signed)
Refill Baclofen 10 mg TID prn x 7 days.   Follow up as needed

## 2021-10-27 NOTE — Progress Notes (Signed)
° ° °  SUBJECTIVE:   CHIEF COMPLAINT / HPI: follow up diabetes   DM Type 2 Asymptomatic.  Compliant with Metformin 500 mg BID.  Last eye exam 3 months ago.  Denies any chest pain, shortness of breath, abdominal pain, nausea, vomiting, polyuria or polydipsia.  Low back pain Continues to have intermittent mild lower muscle spasms. Reports Baclofen has helped with this and would like refill. Not currently having pain today.  PERTINENT  PMH / PSH:  L4-5 stenosis  OBJECTIVE:   BP 120/84    Pulse 67    Ht 6\' 2"  (1.88 m)    Wt 194 lb (88 kg)    SpO2 99%    BMI 24.91 kg/m    General: Alert, no acute distress Cardio: Normal S1 and S2, RRR, no r/m/g Pulm: CTAB, normal work of breathing Extremities: No peripheral edema.  Back - Normal skin, Spine with normal alignment and no deformity.  No tenderness to vertebral process palpation.  Paraspinous muscles are not tender and without spasm.   Range of motion is full at neck and lumbar sacral regions  Diabetic foot exam: No deformities, ulcerations, or other skin breakdown on feet bilaterally.  Sensation intact to monofilament and light touch.  PT and DP pulses intact BL.    ASSESSMENT/PLAN:   Acute midline low back pain Refill Baclofen 10 mg TID prn x 7 days.   Follow up as needed  Type 2 diabetes mellitus without complication (HCC) G8B 6.3 Continue Metformin 500 mg BID Follow up in 3 months  Elevated blood pressure reading in office without diagnosis of hypertension Normotensive Continue Losartan 25 mg daily   Health care maintenance Declined vaccines  Tobacco dependence Tried patches.  Didn't help.  Follow up with Dr Valentina Lucks to discuss other option     Carollee Leitz, MD [place]

## 2021-10-30 ENCOUNTER — Encounter: Payer: Self-pay | Admitting: Pharmacist

## 2021-10-30 ENCOUNTER — Ambulatory Visit (INDEPENDENT_AMBULATORY_CARE_PROVIDER_SITE_OTHER): Payer: 59 | Admitting: Pharmacist

## 2021-10-30 ENCOUNTER — Other Ambulatory Visit: Payer: Self-pay

## 2021-10-30 DIAGNOSIS — F17200 Nicotine dependence, unspecified, uncomplicated: Secondary | ICD-10-CM

## 2021-10-30 MED ORDER — VARENICLINE TARTRATE 0.5 MG PO TABS: 0.5000 mg | ORAL_TABLET | Freq: Two times a day (BID) | ORAL | 0 refills | Status: DC

## 2021-10-30 MED ORDER — VARENICLINE TARTRATE 1 MG PO TABS: 1.0000 mg | ORAL_TABLET | Freq: Two times a day (BID) | ORAL | 3 refills | Status: DC

## 2021-10-30 NOTE — Progress Notes (Signed)
° °  S:  Patient arrives in good spirits.   Patient arrives for evaluation/assistance with tobacco dependence.   Patient was referred and last seen by Primary Care Provider, Dr. Volanda Napoleon, on 10/27/2021.   Age when started using tobacco on a daily basis: 49. Number of cigarettes/day: 10-12. Patient smokes consistently throughout the day. Reports he is the only smoker in the household. Reports people in his life want him to quit smoking.     Fagerstrom Score Question Scoring Patient Score  How soon after waking do you smoke your first cigarette? <5 mins (3) 5-30 mins (2) 31-60 mins (1) >60 mins (0) 3  Do you find it difficult NOT to smoke in places where you shouldn't? Yes(1) No (0) 1  Which cigarette would you most hate to give up? First one in AM (1)  Any other one (0)  1    How many cigarettes do you smoke/day? 10 or less (0) 11-20 (1) 21-30 (2) >30 (3) 0  Do you smoke more during the first few hours after waking? Yes (1) No (0) 0  Do you smoke if you are so ill you cannot get out of bed? Yes (1) No (0) 0   Total Score 5  Score interpretation: low 1-2, low-to-moderate 3-4, moderate 5-7, high >7  Most recent quit attempt 2017 Longest time ever been tobacco free is a few months.  Medications used in past cessation efforts include: Patch - unsuccessful  Rates IMPORTANCE of quitting tobacco on 1-10 scale of 10. Rates CONFIDENCE of quitting tobacco on 1-10 scale of 10.  Most common triggers to use tobacco include; boredom, meals   Motivation to quit: health reasons, feels ready to quit with assistance  Barriers to quitting: reliance on nicotine  A/P: Tobacco use disorder with moderate nicotine dependence of 46 years duration in a patient who is good candidate for success because of current readiness and motivation to quit.    -Initiated varenicline 0.5 mg by mouth once daily with food x7 days, then 0.5 mg by mouth twice daily with food x7 days, then 1 mg by mouth twice daily  with food thereafter. Patient counseled on purpose, proper use, and potential adverse effects, including GI upset..  Written information provided.  F/U Rx Clinic Visit 11/13/2021.  Total time in face-to-face counseling 15 minutes.  Patient seen with Gala Murdoch, PharmD Candidate, and Rebbeca Paul, PharmD - PGY2 Pharmacy Resident.  Marland Kitchen

## 2021-10-30 NOTE — Progress Notes (Signed)
Reviewed: I agree with Dr. Koval's documentation and management. 

## 2021-10-30 NOTE — Patient Instructions (Addendum)
It was nice to see you today!  Today (Thursday, February 16th): Start varenicline (Chantix) one tablet (0.5mg ) ONCE daily with food.   Next Thursday, February 23rd: Start taking one tablet (0.5 mg) TWICE daily with food.   Your 1st bottle will run out after this week (2 weeks total).   The following Thursday, March 2nd: Come back into the office at 9:00am. This is the day you will start using the NEW bottle: You will take one tablet (1 mg) TWICE daily with food.   It is important to take with food so your stomach doesn't get upset.

## 2021-10-30 NOTE — Assessment & Plan Note (Signed)
Tobacco use disorder with moderate nicotine dependence of 46 years duration in a patient who is good candidate for success because of current readiness and motivation to quit.    -Initiated varenicline 0.5 mg by mouth once daily with food x7 days, then 0.5 mg by mouth twice daily with food x7 days, then 1 mg by mouth twice daily with food thereafter. Patient counseled on purpose, proper use, and potential adverse effects, including GI upset.Marland Kitchen

## 2021-11-08 ENCOUNTER — Other Ambulatory Visit: Payer: Self-pay | Admitting: Family Medicine

## 2021-11-08 DIAGNOSIS — E119 Type 2 diabetes mellitus without complications: Secondary | ICD-10-CM

## 2021-11-13 ENCOUNTER — Encounter: Payer: Self-pay | Admitting: Pharmacist

## 2021-11-13 ENCOUNTER — Ambulatory Visit (INDEPENDENT_AMBULATORY_CARE_PROVIDER_SITE_OTHER): Payer: Medicare Other | Admitting: Pharmacist

## 2021-11-13 ENCOUNTER — Other Ambulatory Visit: Payer: Self-pay

## 2021-11-13 DIAGNOSIS — F17200 Nicotine dependence, unspecified, uncomplicated: Secondary | ICD-10-CM

## 2021-11-13 NOTE — Progress Notes (Signed)
Reviewed: Agree with Dr. Koval's documentation and management. 

## 2021-11-13 NOTE — Progress Notes (Signed)
? ?  S:  ?Patient arrives in good spirits and ambulating without assistance.   Patient arrives for evaluation/assistance with tobacco dependence.   ?Patient was referred and last seen by Primary Care Provider, Dr/ Volanda Napoleon pm 10/27/2021.  He is seen in follow-up from a visit with me on 2/16.   ? ?Brand smoked [place] cigarettes. Number of cigarettes/day 6-7. Estimated nicotine content per cigarette (mg) > 1mg .  ?Estimated nicotine intake per day 6-10 mg.   ? ?Medications used in past cessation efforts include: varenicline ?Rates IMPORTANCE of quitting tobacco remains high. ?Rates CONFIDENCE of quitting tobacco remains high. ? ?Most common triggers to use tobacco include; when working. ? ?Motivation to quit: health ? ? ?A/P: ?Tobacco use disorder withmoderate nicotine dependence of 46 years duration in a patient who is good candidate for success because of current readiness and motivation to quit and progress in tapering to 6-7 cigs per day.  ?-Today he will increase his varenicline to 1 mg by mouth twice daily with food thereafter. Patient counseled on purpose, proper use, and potential adverse effects, including GI upset.. ?- Goal for the next week is to cut down to 3 cigarettes per day.  ?- QUIT all Cigarettes on Saturday March 11th. ? ?Written information provided.  F/U visit in ~ 2 weeks shortly after quit date.  Total time in face-to-face counseling 16 minutes.  Patient seen with Earvin Hansen PharmD Candidate.  ?. ? ? ?

## 2021-11-13 NOTE — Patient Instructions (Addendum)
Nice to see you today - Great job with cutting down. ? ?Goal for the next week is to cut down to 3 cigarettes per day.  ? ?QUIT all Cigarettes on Saturday March 11th.  ? ? ?Follow-Up Monday 13th at 9:00 ?

## 2021-11-13 NOTE — Assessment & Plan Note (Signed)
Tobacco use disorder withmoderate nicotine dependence of 46 years duration in a patient who is good candidate for success because of current readiness and motivation to quit and progress in tapering to 6-7 cigs per day.  ?-Today he will increase his varenicline to 1 mg by mouth twice daily with food thereafter. Patient counseled on purpose, proper use, and potential adverse effects, including GI upset.. ?- Goal for the next week is to cut down to 3 cigarettes per day.  ?- QUIT all Cigarettes on Saturday March 11th. ?

## 2021-11-24 ENCOUNTER — Ambulatory Visit (INDEPENDENT_AMBULATORY_CARE_PROVIDER_SITE_OTHER): Payer: Medicare Other | Admitting: Pharmacist

## 2021-11-24 ENCOUNTER — Encounter: Payer: Self-pay | Admitting: Pharmacist

## 2021-11-24 ENCOUNTER — Other Ambulatory Visit: Payer: Self-pay

## 2021-11-24 ENCOUNTER — Telehealth: Payer: Self-pay | Admitting: Family Medicine

## 2021-11-24 DIAGNOSIS — I10 Essential (primary) hypertension: Secondary | ICD-10-CM

## 2021-11-24 DIAGNOSIS — F17200 Nicotine dependence, unspecified, uncomplicated: Secondary | ICD-10-CM | POA: Diagnosis not present

## 2021-11-24 DIAGNOSIS — R030 Elevated blood-pressure reading, without diagnosis of hypertension: Secondary | ICD-10-CM

## 2021-11-24 MED ORDER — LOSARTAN POTASSIUM 50 MG PO TABS: 50.0000 mg | ORAL_TABLET | Freq: Every day | ORAL | 3 refills | Status: DC

## 2021-11-24 NOTE — Progress Notes (Signed)
? ?  S:  ?Patient arrives in good spirits and ambulating without assistance. Patient arrives for a follow-up evaluation for smoking cessation. Patient was referred and last seen by Primary Care Provider Dr. Volanda Napoleon on 10/27/2021.  ? ?Brand smoked [place] cigarettes. Number of cigarettes/day 4. Estimated nicotine content per cigarette (mg) 1.  ?Estimated nicotine intake per day 4 mg.   ? ?Patient reports that he smokes his first two cigarettes in the morning before 9 AM. His next two cigarettes are in the afternoon and before dinnertime. He states that he has only been taking varenicline 1 mg once daily because that's what his bottle says; we will confirm his pharmacy has the correct instructions. The next cigarette he would like to quit is his first cigarette of the day, and he feels confident that he can quit by his next appointment.  ? ?Fagerstrom Score ?Question Scoring Patient Score  ?How soon after waking do you smoke your first cigarette? <5 mins (3) ?5-30 mins (2) ?31-60 mins (1) ?>60 mins (0) 2  ?Do you find it difficult NOT to smoke in places where you shouldn't? Yes(1) ?No (0)   ?Which cigarette would you most hate to give up? First one in AM (1) ? ?Any other one (0) 0 ? ? ?  ?How many cigarettes do you smoke/day? 10 or less (0) ?11-20 (1) ?21-30 (2) ?>30 (3) 0  ?Do you smoke more during the first few hours after waking? Yes (1) ?No (0) 1  ?Do you smoke if you are so ill you cannot get out of bed? Yes (1) ?No (0)   ? Total Score   ?Score interpretation: low 1-2, low-to-moderate 3-4, moderate 5-7, high >7 ? ?Most recent quit attempt 2017; now ongoing ? ?Medications used in past cessation efforts include: varenicline ? ?Rates IMPORTANCE of quitting tobacco on 1-10 scale of 10. ?Rates CONFIDENCE of quitting tobacco on 1-10 scale of 10. ? ?Most common triggers to use tobacco include: other smokes such as family members and coworkers  ? ?Motivation to quit: health ? ?A/P: ?Tobacco use disorder with moderate  nicotine dependence of 46 years duration in a patient who is good candidate for success because of current readiness and progress in reducing to 4 cigarettes per day. ?- Continue varenicline 1 mg by mouth twice daily with food. Patient was reminded of the proper instructions of this medication and I called [facility] to verify the correct instructions. ?- His goal is to stop smoking all cigarettes by his next appointment on Monday, December 08, 2021.  ? ?Elevated blood pressure ?- Blood pressure today is 137/84 mmHg. His blood pressures have ranged from normotensive to hypertensive during his appointments over the last few months. ?- Increase to losartan 50 mg daily  ? ?Written information provided. F/U Rx Clinic Visit  12/08/2021 at 9 AM. ?Total time in face-to-face counseling 26 minutes.  Patient seen with Earvin Hansen PharmD Candidate and Zenaida Deed, PharmD, PGY 1 pharmacy resident.  ? ?  ?

## 2021-11-24 NOTE — Assessment & Plan Note (Signed)
Tobacco use disorder with moderate nicotine dependence of 46 years duration in a patient who is good candidate for success because of current readiness and progress in reducing to 4 cigarettes per day. ?- Continue varenicline 1 mg by mouth twice daily with food. Patient was reminded of the proper instructions of this medication and I called [facility] to verify the correct instructions. ?- His goal is to stop smoking all cigarettes by his next appointment on Monday, December 08, 2021.  ? ?

## 2021-11-24 NOTE — Telephone Encounter (Signed)
Patient came in stating that his prescription for Varencline changed (dosage changed), and he would like to know if he can keep taking the previous one until it runs out or if he should just start taking the new prescription.  ?

## 2021-11-24 NOTE — Assessment & Plan Note (Signed)
Elevated blood pressure ?- Blood pressure today is 137/84 mmHg. His blood pressures have ranged from normotensive to hypertensive during his appointments over the last few months. ?- Increase to losartan 50 mg daily  ?

## 2021-11-24 NOTE — Progress Notes (Signed)
Reviewed: I agree with Dr. Koval's documentation and management. 

## 2021-11-24 NOTE — Patient Instructions (Addendum)
Nice to see you today! ? ?Continue to take Chantix 1 mg twice daily with meals. We recommend taking one tablet with breakfast and one tablet with dinner. ? ?We're increasing your losartan dose to losartan 50 mg daily. You can use your current prescription for this; you can take 2 tablets of the 25 mg pill, and we will send a new prescription for 50 mg to your pharmacy. Once the 25 mg tablets are gone, take 1 of the 50 mg tablet daily.  ? ?Next appointment: 12/08/2021 at 9 AM  ?

## 2021-11-24 NOTE — Telephone Encounter (Signed)
Routing to Dr. Valentina Lucks since pt had appointment with him today and this medication was discussed at visit. Will also route to PCP as an FYI. [name], CMA ? ?

## 2021-11-25 NOTE — Telephone Encounter (Signed)
Patient contacted for follow/up of tobacco intake reduction / tobacco cessation attempt.  ? ?Clarified Varenicline dosing as '1mg'$  twice daily with food. ?Patient denies any significant side effects from tobacco cessation therapy.  ? ?Patient verbalized treatment plan.  ?Total time with patient call and documentation of interaction: 8 minutes. ? ?F/U in office - 2 weeks.  ? ? ?

## 2021-11-25 NOTE — Telephone Encounter (Signed)
Noted and agree. 

## 2021-12-08 ENCOUNTER — Ambulatory Visit (INDEPENDENT_AMBULATORY_CARE_PROVIDER_SITE_OTHER): Payer: Medicare Other | Admitting: Pharmacist

## 2021-12-08 ENCOUNTER — Encounter: Payer: Self-pay | Admitting: Pharmacist

## 2021-12-08 ENCOUNTER — Other Ambulatory Visit: Payer: Self-pay

## 2021-12-08 DIAGNOSIS — F17200 Nicotine dependence, unspecified, uncomplicated: Secondary | ICD-10-CM

## 2021-12-08 DIAGNOSIS — K5900 Constipation, unspecified: Secondary | ICD-10-CM

## 2021-12-08 DIAGNOSIS — I10 Essential (primary) hypertension: Secondary | ICD-10-CM

## 2021-12-08 MED ORDER — LACTULOSE 10 GM/15ML PO SOLN: ORAL | 1 refills | Status: DC

## 2021-12-08 NOTE — Patient Instructions (Addendum)
Nice to see you today! Congrats on quitting smoking! ? ?We would like you to continue your Chantix (varenicline) 1 mg twice daily with meals.  ? ?Continue taking your blood pressure medication. Until you run out of the 25 mg tablets, continue taking 2 tablets daily. When you pick up your new prescription of the 50 mg tablets, take 1 tablet daily. ? ?We have sent you a refill for Lactulose. Continue all other medications.  ? ?We will call you in 2 weeks to check in and will follow up in person with you in 1 month on April 24.  ?

## 2021-12-08 NOTE — Progress Notes (Signed)
Reviewed: I agree with Dr. Koval's documentation and management. 

## 2021-12-08 NOTE — Assessment & Plan Note (Signed)
Tobacco use disorder with moderate nicotine dependence of 46 years duration in a patient who is excellent candidate for success because of confidence in quitting, motivators, and reports that he has quit since yesterday.    ?-Congratulated patient on quitting smoking. Continued varenicline 1 mg by mouth twice daily with food. Patient counseled on purpose, proper use, and potential adverse effects, including GI upset. Patient counseled on importance to continue medication even past smoking quit date to help with cravings and withdrawal. Will discuss options and assess progress at next visit. ? ?

## 2021-12-08 NOTE — Progress Notes (Signed)
? ?  S:  ?Patient arrives in good spirits without assistance.   Patient arrives for evaluation/assistance with tobacco dependence.   ?Patient was referred and last seen by Primary Care Provider on 10/27/21.  ? ?Patient reports he has quit smoking since yesterday (Saturday). Patient reports that if he can make it through the first week he "will be good" and it will get easier. Patient reports that he would like to stop taking Chantix because he is ready to quit all together. ? ?Patient reports that people at work smoke around him but no one offers a cigarette to him.  ? ?Pt reports watching sports and sleeping helps keep him occupied and busy to keep his mind of cigarettes. He reports he has not woken up in the morning and smoked in 2 days. Reports he wakes up and prays instead. ? ?Patient takes first Chantix (varenicline)  pill at 8 am with breakfast. Takes other dose around 3-4 pm with food. ? ?Pt reports a motivation to quit is avoiding cancer. ? ?Patient reports he has been feeling okay with the increased losartan dose. Reports he does not check BP at home. Patient denies any symptoms of dizziness. In office BP reading is 133/71. ? ?Most recent quit attempt 2017; now ongoing ? ?Medications used in past cessation efforts include: varenicline ? ?Motivation to quit: health, avoiding cancer ? ?A/P: ?Tobacco use disorder with moderate nicotine dependence of 46 years duration in a patient who is excellent candidate for success because of confidence in quitting, motivators, and reports that he has quit since yesterday.    ?-Congratulated patient on quitting smoking. Continued varenicline 1 mg by mouth twice daily with food. Patient counseled on purpose, proper use, and potential adverse effects, including GI upset. Patient counseled on importance to continue medication even past smoking quit date to help with cravings and withdrawal. Will discuss options and assess progress at next visit. ? ?Hypertension: Blood pressure  today is 133/71. Continued Losartan 50 mg daily. Obtain new BMP at next visit. ? ?Chronic constipation: Patient requested refill for Lactulose and this was sent in to pharmacy. ? ?Written information provided.  F/U phone call in 2 weeks.  F/U Rx Clinic Visit  in 1 month on April 24. ? Total time in face-to-face counseling 18 minutes.  Patient seen with Earvin Hansen PharmD Candidate. ?

## 2021-12-08 NOTE — Assessment & Plan Note (Signed)
Hypertension: Blood pressure today is 133/71. Continued Losartan 50 mg daily. Obtain new BMP at next visit. ?

## 2021-12-22 ENCOUNTER — Other Ambulatory Visit: Payer: Self-pay | Admitting: Family Medicine

## 2021-12-22 DIAGNOSIS — E119 Type 2 diabetes mellitus without complications: Secondary | ICD-10-CM

## 2021-12-31 ENCOUNTER — Telehealth: Payer: Self-pay

## 2021-12-31 NOTE — Telephone Encounter (Signed)
Fax from pharmacy needing clarification: ? ?Rx for ?Losartan '50mg'$  tab.  ?Take 1 tab by mouth at bedtime. ? ?"Pt is taking 2 daily, need new script." ? ?Please send new Rx if appropriate. ? ?Ottis Stain, CMA ? ?

## 2022-01-01 ENCOUNTER — Telehealth: Payer: Self-pay | Admitting: Family Medicine

## 2022-01-01 NOTE — Telephone Encounter (Signed)
Dr. Valentina Lucks increased his Losartan to 50 mg daily.  This is the correct dosage.  Patient needs to be evaluated and clarification of medications ? ?Carollee Leitz, MD ?[facility]  ?

## 2022-01-01 NOTE — Telephone Encounter (Signed)
Patient went to pharmacy and need BP medication amount increased because was told to take two a day. The prescription was written for one a day.  He is currently out of medication.     ?

## 2022-01-01 NOTE — Telephone Encounter (Signed)
Called pt. He got confused. He thinks he has a bottle of the 50 mg tablets in his cabinet. He is coming in on Monday to see Dr. Valentina Lucks. I told him to bring his meds and verify what he is taking is what he should be taking. Ottis Stain, CMA ? ?

## 2022-01-01 NOTE — Telephone Encounter (Signed)
See other message. I have spoken to pt. He will see Dr. Valentina Lucks on Monday and will go over his medication. Ottis Stain, CMA ? ?

## 2022-01-02 ENCOUNTER — Telehealth: Payer: Self-pay | Admitting: Family Medicine

## 2022-01-02 NOTE — Telephone Encounter (Signed)
Pt walked in to say he found his BP medication and is taking one at bed time. ?

## 2022-01-05 ENCOUNTER — Ambulatory Visit (INDEPENDENT_AMBULATORY_CARE_PROVIDER_SITE_OTHER): Payer: Medicare Other | Admitting: Pharmacist

## 2022-01-05 ENCOUNTER — Encounter: Payer: Self-pay | Admitting: Pharmacist

## 2022-01-05 DIAGNOSIS — F17200 Nicotine dependence, unspecified, uncomplicated: Secondary | ICD-10-CM

## 2022-01-05 NOTE — Progress Notes (Signed)
Reviewed: Agree with Dr. Koval's documentation and management. 

## 2022-01-05 NOTE — Assessment & Plan Note (Signed)
Tobacco use disorder with mild nicotine dependence of 46 years duration in a patient who is excellent candidate for success because of success with ongoing quit attempt. Reports complete abstinence for ~ 1 month.    ?- GOAL: Continue tobacco cessation. ?-Lowered dose of varenicline 1 mg by mouth from BID with meals to once daily with meals. Counseled patient that once he finishes this medication he will no longer continue taking it.  ?

## 2022-01-05 NOTE — Progress Notes (Signed)
? ?  S:  ?Patient arrives in good spirits without assistance.   Patient arrives for evaluation/assistance with tobacco dependence.   ?Patient was referred and last seen by Primary Care Provider on 10/27/21. Patient was last seen by pharmacy team on 12/08/21.  ? ?Patient reports he has not smoked in > 1 month. He reports that he still works around people that smoke and it can get hard during that time, but he has not smoked. He does report that it is getting easier. ? ?He reports that he is still taking the Chantix (varenicline). He reports that his breathing has been better since he has quit smoking and that his cough is gone now. He reports his sense of smell and taste has improved. ? ?Most recent quit attempt ongoing ?Longest time ever been tobacco free currently 1 month. ? ?Medications used in past cessation efforts include: varenicline ? ?Rates CONFIDENCE of continuing tobacco cessation on 1-10 scale of 10. He reports that he can be "quit forever". ? ?Most common triggers to use tobacco include; being around other smokers  ? ?Motivation to quit: health ? ?A/P: ?Tobacco use disorder with mild nicotine dependence of 46 years duration in a patient who is excellent candidate for success because of success with ongoing quit attempt. Reports complete abstinence for ~ 1 month.    ?- GOAL: Continue tobacco cessation. ?-Lowered dose of varenicline 1 mg by mouth from BID with meals to once daily with meals. Counseled patient that once he finishes this medication he will no longer continue taking it.  ? ?Written information provided.  F/U PCP Visit in June.  NO additional pharmacy clinic follow-up scheduled/planned.   Total time in face-to-face counseling 18 minutes.  Patient seen with Earvin Hansen PharmD Candidate. ? ?

## 2022-01-05 NOTE — Patient Instructions (Signed)
Nice to see you today! ? ?Congrats on your success and hard work to quit smoking.  ? ?Start taking your Chantix (varenicline) only once daily. Once you finish this medication, you will no longer have to keep taking it. ? ?You will follow up with Dr. Volanda Napoleon in June. ?

## 2022-01-06 ENCOUNTER — Encounter: Payer: Self-pay | Admitting: Family Medicine

## 2022-02-12 NOTE — Progress Notes (Unsigned)
    SUBJECTIVE:   CHIEF COMPLAINT / HPI: diabetes check  HTN Does not check BP at home.  Denies any headaches, visual disturbances, chest pain, shortness of breath or lower extremity edema.  Compliant with Losartan 50 mg at night.  DM Type 2 CBG at home 100's.  Denies any hypoglycemic events, polyuria, abdominal pain, nausea or vomiting.   Compliant with Metformin 500 mg twice daily.  ED Requesting Viagra.  Has not been able to have erection in a while.  Reports no morning erection.  Recently took friends Viagra and this helped.  Was able to obtain erection.  PERTINENT  PMH / PSH:  HTN DM Type 2 HLD  OBJECTIVE:   BP (!) 141/85   Pulse 67   Ht '6\' 2"'$  (1.88 m)   Wt 194 lb (88 kg)   SpO2 99%   BMI 24.91 kg/m    General: Alert, no acute distress Cardio: Normal S1 and S2, RRR, no r/m/g Pulm: CTAB, normal work of breathing Abdomen: Bowel sounds normal. Abdomen soft and non-tender.  Extremities: No peripheral edema.    ASSESSMENT/PLAN:   Hypertension Elevated on repeat.  Not at goal -Increase Losartan 100 mg qhs -Follow up in 1 week, repeat Bmet at that time -Strict return precautions provided   Type 2 diabetes mellitus without complication (HCC) O5F 6.5 today.  Discussed increasing Metformin and opted to continue current dose.  Wil try to optimize diet and exercise -Repeat A1c in 3 months, if increases can increase medication at that time   Health care maintenance Annual Low Dose CT chest Abdominal u/s for AAA screening ordered today Recommend Shingles Vaccine Recommend Tetanus Vaccine Had eye exam few months ago, requested that patient have results faxed to clinic to update chart   Tobacco dependence Has been tobacco free for aprox 1-2 months.  Not currently on medications and doing well. Continue to encourage smoking cessation Congratulated on current success Continue to follow  Constipation Refill lactulose  Encourage increase in water intake to  decrease dehydration  Erectile dysfunction Has been an issue for long time.  Unable to initiate erection, has sexual desire. Had previously tried Viagra which helped achieve erection. Has no contraindications to PDE5 inhibitors -Start Viagra 50 mg 1-2 tablets 30 mins -4hrs prior to sexual activity. Recommend to take on empty stomach for better outcome. -Discussed that may cause priapism and if erection for 4 hrs or more to go to ED  -Could consider work up for low testosterone at next visit.      Carollee Leitz, MD [place]

## 2022-02-12 NOTE — Patient Instructions (Addendum)
Thank you for coming to see me today. It was a pleasure.   A1c today is 6.5 Continue Metformin 500 mg twice a day.  If your next A1c is elevated we will increase this medication.  Increase your Losartan to 100 mg at night.  Take 2 of the 50 mg tablets that you have now.  Follow up with me in 1 week.  Congratulation on you success with quitting smoking.    I have placed an order for CT chest to monitor for lung cancer as well as an ultrasound to check the main artery in you abdomen. Will call you with the appointments for these.  I have ordered an ultrasound of your abdomen.  Take Viagra 50 mg 1-2 tablets 27mns - 4hours before.  You should take this on an empty stomach for best results.  Recommend Shingles vaccine.  This is a 2 dose series and can be given at your local pharmacy.  Please talk to your pharmacist about this.   Recommend Pneumonia and COVID and Tetanus vaccines  Please follow-up with PCP 1 week  If you have any questions or concerns, please do not hesitate to call the office at [phone]  Best,   TCarollee Leitz MD

## 2022-02-16 ENCOUNTER — Encounter: Payer: Self-pay | Admitting: Family Medicine

## 2022-02-16 ENCOUNTER — Ambulatory Visit (INDEPENDENT_AMBULATORY_CARE_PROVIDER_SITE_OTHER): Payer: Medicare Other | Admitting: Family Medicine

## 2022-02-16 VITALS — BP 141/85 | HR 67 | Ht 74.0 in | Wt 194.0 lb

## 2022-02-16 DIAGNOSIS — E119 Type 2 diabetes mellitus without complications: Secondary | ICD-10-CM

## 2022-02-16 DIAGNOSIS — N529 Male erectile dysfunction, unspecified: Secondary | ICD-10-CM | POA: Diagnosis not present

## 2022-02-16 DIAGNOSIS — F17200 Nicotine dependence, unspecified, uncomplicated: Secondary | ICD-10-CM

## 2022-02-16 DIAGNOSIS — Z0000 Encounter for general adult medical examination without abnormal findings: Secondary | ICD-10-CM

## 2022-02-16 DIAGNOSIS — I10 Essential (primary) hypertension: Secondary | ICD-10-CM

## 2022-02-16 DIAGNOSIS — R030 Elevated blood-pressure reading, without diagnosis of hypertension: Secondary | ICD-10-CM

## 2022-02-16 DIAGNOSIS — K5900 Constipation, unspecified: Secondary | ICD-10-CM

## 2022-02-16 LAB — POCT GLYCOSYLATED HEMOGLOBIN (HGB A1C): HbA1c, POC (controlled diabetic range): 6.5 % (ref 0.0–7.0)

## 2022-02-16 MED ORDER — LACTULOSE 10 GM/15ML PO SOLN: ORAL | 1 refills | Status: DC

## 2022-02-16 MED ORDER — LOSARTAN POTASSIUM 50 MG PO TABS: 100.0000 mg | ORAL_TABLET | Freq: Every day | ORAL | 3 refills | Status: DC

## 2022-02-16 MED ORDER — SILDENAFIL CITRATE 50 MG PO TABS: 50.0000 mg | ORAL_TABLET | Freq: Every day | ORAL | 1 refills | Status: DC | PRN

## 2022-02-17 ENCOUNTER — Encounter: Payer: Self-pay | Admitting: Family Medicine

## 2022-02-17 DIAGNOSIS — N529 Male erectile dysfunction, unspecified: Secondary | ICD-10-CM | POA: Insufficient documentation

## 2022-02-17 NOTE — Assessment & Plan Note (Addendum)
Refill lactulose  Encourage increase in water intake to decrease dehydration

## 2022-02-17 NOTE — Assessment & Plan Note (Addendum)
A1c 6.5 today.  Discussed increasing Metformin and opted to continue current dose.  Wil try to optimize diet and exercise -Repeat A1c in 3 months, if increases can increase medication at that time

## 2022-02-17 NOTE — Assessment & Plan Note (Signed)
Elevated on repeat.  Not at goal -Increase Losartan 100 mg qhs -Follow up in 1 week, repeat Bmet at that time -Strict return precautions provided

## 2022-02-17 NOTE — Assessment & Plan Note (Signed)
Has been an issue for long time.  Unable to initiate erection, has sexual desire. Had previously tried Viagra which helped achieve erection. Has no contraindications to PDE5 inhibitors -Start Viagra 50 mg 1-2 tablets 30 mins -4hrs prior to sexual activity. Recommend to take on empty stomach for better outcome. -Discussed that may cause priapism and if erection for 4 hrs or more to go to ED  -Could consider work up for low testosterone at next visit.

## 2022-02-17 NOTE — Assessment & Plan Note (Addendum)
Annual Low Dose CT chest Abdominal u/s for AAA screening ordered today Recommend Shingles Vaccine Recommend Tetanus Vaccine Had eye exam few months ago, requested that patient have results faxed to clinic to update chart

## 2022-02-17 NOTE — Assessment & Plan Note (Signed)
Has been tobacco free for aprox 1-2 months.  Not currently on medications and doing well. Continue to encourage smoking cessation Congratulated on current success Continue to follow

## 2022-02-20 ENCOUNTER — Ambulatory Visit
Admission: RE | Admit: 2022-02-20 | Discharge: 2022-02-20 | Disposition: A | Discharge status: Home / Self Care | Payer: Medicare Other | Source: Ambulatory Visit | Attending: Family Medicine | Admitting: Family Medicine

## 2022-02-20 DIAGNOSIS — F17200 Nicotine dependence, unspecified, uncomplicated: Secondary | ICD-10-CM

## 2022-02-22 ENCOUNTER — Encounter: Payer: Self-pay | Admitting: Family Medicine

## 2022-02-22 NOTE — Progress Notes (Signed)
Letter sent with normal abdominal u/s screening results.  Carollee Leitz, MD [facility]

## 2022-02-26 ENCOUNTER — Encounter: Payer: Self-pay | Admitting: Family Medicine

## 2022-02-26 ENCOUNTER — Ambulatory Visit (INDEPENDENT_AMBULATORY_CARE_PROVIDER_SITE_OTHER): Payer: Medicare Other | Admitting: Family Medicine

## 2022-02-26 ENCOUNTER — Ambulatory Visit: Payer: Medicare Other | Admitting: Pharmacist

## 2022-02-26 VITALS — BP 110/62 | HR 71 | Wt 190.0 lb

## 2022-02-26 DIAGNOSIS — I10 Essential (primary) hypertension: Secondary | ICD-10-CM

## 2022-02-26 MED ORDER — LOSARTAN POTASSIUM 100 MG PO TABS: 100.0000 mg | ORAL_TABLET | Freq: Every day | ORAL | 3 refills | Status: DC

## 2022-02-26 NOTE — Progress Notes (Unsigned)
    SUBJECTIVE:   CHIEF COMPLAINT / HPI:   Presents for follow up for elevated blood pressure. Seen in clinic on 06/05 and losartan increased from 50 mg to 100 mg nightly.  Patient has not been checking blood pressure at home.  Denies any headaches, visual changes, shortness of breath, chest pain, nausea/vomiting, abdominal pain or lower extremity edema.   PERTINENT  PMH / PSH:  Hypertension Type 2 diabetes Hypercholesterolemia   OBJECTIVE:   BP 110/62   Pulse 71   Wt 190 lb (86.2 kg)   SpO2 98%   BMI 24.39 kg/m    General: Alert, no acute distress Cardio: Normal S1 and S2, RRR, no r/m/g Pulm: CTAB, normal work of breathing Abdomen: Bowel sounds normal. Abdomen soft and non-tender.  Extremities: No peripheral edema.    ASSESSMENT/PLAN:   Hypertension Tolerating increase in antihypertensive.  Blood pressure within goal. -Bmet today -Continue losartan 100 mg nightly -Strict return precautions provided -Follow-up with PCP as needed     Carollee Leitz, MD [place]

## 2022-02-26 NOTE — Patient Instructions (Signed)
Thank you for coming to see me today. It was a pleasure.   We will get some labs today.  If they are abnormal or we need to do something about them, I will call you.  If they are normal, I will send you a message on MyChart (if it is active) or a letter in the mail.  If you don't hear from [place] in 2 weeks, please call the office at the number below.   Continue losartan 100 mg at night.  You can finish the 50 mg tablets you have, take 2 tablets at night.  Once you have run out of the 50 mg tablets go to the pharmacy and pick up the new prescription.  Your new prescription is losartan 100 mg.  Take 1 tablet at night.  You are due for an eye exam.  Please make sure that you call your eye doctor to have this scheduled and have them fax the results to our office.   Recommend Shingles vaccine.  This is a 2 dose series and can be given at your local pharmacy.  Please talk to your pharmacist about this.   Recommend tetanus vaccine  Please follow-up with PCP in 3 months  If you have any questions or concerns, please do not hesitate to call the office at [phone].  Best,   Carollee Leitz, MD

## 2022-02-27 ENCOUNTER — Ambulatory Visit
Admission: RE | Admit: 2022-02-27 | Discharge: 2022-02-27 | Disposition: A | Discharge status: Home / Self Care | Payer: Medicare Other | Source: Ambulatory Visit | Attending: Family Medicine | Admitting: Family Medicine

## 2022-02-27 ENCOUNTER — Encounter: Payer: Self-pay | Admitting: Family Medicine

## 2022-02-27 DIAGNOSIS — F17200 Nicotine dependence, unspecified, uncomplicated: Secondary | ICD-10-CM

## 2022-02-27 LAB — BASIC METABOLIC PANEL
BUN/Creatinine Ratio: 13 (ref 10–24)
BUN: 13 mg/dL (ref 8–27)
CO2: 21 mmol/L (ref 20–29)
Calcium: 10.1 mg/dL (ref 8.6–10.2)
Chloride: 104 mmol/L (ref 96–106)
Creatinine, Ser: 1.03 mg/dL (ref 0.76–1.27)
Glucose: 147 mg/dL — ABNORMAL HIGH (ref 70–99)
Potassium: 4.7 mmol/L (ref 3.5–5.2)
Sodium: 140 mmol/L (ref 134–144)
eGFR: 80 mL/min/{1.73_m2} (ref >59)

## 2022-02-27 NOTE — Progress Notes (Signed)
Letter with normal lab results routed to front office to mail to patient.  Carollee Leitz, MD [facility]

## 2022-03-01 ENCOUNTER — Encounter: Payer: Self-pay | Admitting: Family Medicine

## 2022-03-01 NOTE — Assessment & Plan Note (Signed)
Tolerating increase in antihypertensive.  Blood pressure within goal. -Bmet today -Continue losartan 100 mg nightly -Strict return precautions provided -Follow-up with PCP as needed

## 2022-03-02 ENCOUNTER — Encounter: Payer: Self-pay | Admitting: Family Medicine

## 2022-03-02 ENCOUNTER — Ambulatory Visit (INDEPENDENT_AMBULATORY_CARE_PROVIDER_SITE_OTHER): Payer: Medicare Other | Admitting: Family Medicine

## 2022-03-02 VITALS — BP 110/68 | HR 69 | Wt 190.0 lb

## 2022-03-02 DIAGNOSIS — S8991XA Unspecified injury of right lower leg, initial encounter: Secondary | ICD-10-CM | POA: Diagnosis not present

## 2022-03-02 NOTE — Patient Instructions (Signed)
Thank you for coming to see me today. It was a pleasure.   You have a soft tissue injury.  Likely this will take a week or 2 before it gets better.  You can take Tylenol for any discomfort every hours as needed. Elevate your leg to decrease any swelling. Use ice as needed.  If you start having any fevers, worsening pain, increased swelling or discharge please call your doctor or go to urgent care.   Please follow-up with PCP as needed  If you have any questions or concerns, please do not hesitate to call the office at [phone].  Best,   Carollee Leitz, MD

## 2022-03-02 NOTE — Progress Notes (Unsigned)
    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  Incident occurred about 3 days ago.  Reports some initial swelling that has decreased since.  Denies any significant pain.  Slightly tender to touch.  Able to bear weight.  Has open area on right lower leg without bleeding, drainage.  Denies any fevers.  PERTINENT  PMH / PSH:    OBJECTIVE:   BP 110/68   Pulse 69   Wt 190 lb (86.2 kg)   SpO2 98%   BMI 24.39 kg/m    General: Alert, no acute distress Cardio: Normal S1 and S2, RRR, no r/m/g Pulm: CTAB, normal work of breathing Extremities: No peripheral edema.      ASSESSMENT/PLAN:   Lower leg injury, right, initial encounter Low suspicion for fracture given able to weight-bear, pain is significantly reduced and no significant force of trauma.  Small open area to medial aspect of lower leg without any drainage, no signs of infection.  Suspect soft tissue injury from recent injury. -Declined tetanus vaccine -Elevate leg when sitting -Ibuprofen 200 mg 3 times daily as needed for discomfort -Ice as needed -Strict return precautions provided -Follow-up as needed.     Carollee Leitz, MD [place]

## 2022-03-03 ENCOUNTER — Encounter: Payer: Self-pay | Admitting: Family Medicine

## 2022-03-03 DIAGNOSIS — S8991XA Unspecified injury of right lower leg, initial encounter: Secondary | ICD-10-CM | POA: Insufficient documentation

## 2022-03-03 NOTE — Assessment & Plan Note (Addendum)
Low suspicion for fracture given able to weight-bear, pain is significantly reduced and no significant force of trauma.  Small open area to medial aspect of lower leg without any drainage, no signs of infection.  Suspect soft tissue injury from recent injury. -Declined tetanus vaccine -Elevate leg when sitting -Ibuprofen 200 mg 3 times daily as needed for discomfort -Ice as needed -Strict return precautions provided -Follow-up as needed.

## 2022-03-09 ENCOUNTER — Ambulatory Visit (INDEPENDENT_AMBULATORY_CARE_PROVIDER_SITE_OTHER): Payer: Medicare Other | Admitting: Family Medicine

## 2022-03-09 ENCOUNTER — Encounter: Payer: Self-pay | Admitting: Family Medicine

## 2022-03-09 VITALS — BP 128/78 | HR 66 | Ht 74.0 in | Wt 190.4 lb

## 2022-03-09 DIAGNOSIS — G8929 Other chronic pain: Secondary | ICD-10-CM

## 2022-03-09 DIAGNOSIS — M25512 Pain in left shoulder: Secondary | ICD-10-CM | POA: Diagnosis not present

## 2022-03-09 NOTE — Progress Notes (Unsigned)
    SUBJECTIVE:   CHIEF COMPLAINT / HPI: Left shoulder pain  Patient reports left shoulder pain from a fall 8 months ago.  Has been evaluated by orthopedics, neurosurgery at that time per patient.  Reports pain acts up intermittently.  Has not tried ibuprofen, Tylenol for pain.  Pain is intermittent.  Denies any neck pain, limited range of motion of shoulder, weakness, numbness or tingling of left arm.  Able to perform daily activities.  Denies any pain today.  Reports having pain over the weekend after he was working so he took a Financial risk analyst over the weekend which helped.  He is requesting a prescription for same as he feels that this is the only thing that helps him.  PERTINENT  PMH / PSH:  Hypertension Diabetes type 2 Hyperlipidemia  OBJECTIVE:   BP 128/78   Pulse 66   Ht '6\' 2"'$  (1.88 m)   Wt 190 lb 6.4 oz (86.4 kg)   SpO2 99%   BMI 24.45 kg/m    General: Alert, no acute distress Cardio: Normal S1 and S2, RRR, no r/m/g Pulm: CTAB, normal work of breathing Left Shoulder Exam: No swelling, ecchymoses.  No gross deformity. No TTP. FROM. Negative Hawkins, Neers. Negative Yergasons. Strength 5/5 with empty can and resisted internal/external rotation. Negative apprehension. NV intact distally.  Negative Spurling's test   ASSESSMENT/PLAN:   Chronic left shoulder pain Left shoulder exam benign.  No red flags.  Previous x-rays reviewed from 2 years ago from fall showed no acute fracture or displacement.  Mild arthrosis was noted in the left [facility] joint.  Given chronic injury feels that ibuprofen and Tylenol trial would be more appropriate than prescription for Percocet.  Discussed this with patient but he declined anything but Percocet.  Reported that he only wanted them for every now and again.  Discussed with patient risks of initiating opioids, dizziness, increased falls, addictive properties.  Recommended higher dose Tylenol and ibuprofen and to follow-up in 2 weeks.  Also  offered sports medicine referral for possible ultrasound at that time.  Patient declined recommendations. Follow-up with PCP if worsens.     Carollee Leitz, MD [place]

## 2022-03-12 ENCOUNTER — Encounter: Payer: Self-pay | Admitting: Family Medicine

## 2022-03-12 DIAGNOSIS — G8929 Other chronic pain: Secondary | ICD-10-CM | POA: Insufficient documentation

## 2022-03-12 NOTE — Assessment & Plan Note (Addendum)
Left shoulder exam benign.  No red flags.  Previous x-rays reviewed from 2 years ago from fall showed no acute fracture or displacement.  Mild arthrosis was noted in the left [facility] joint.  Given chronic injury feels that ibuprofen and Tylenol trial would be more appropriate than prescription for Percocet.  Discussed this with patient but he declined anything but Percocet.  Reported that he only wanted them for every now and again.  Discussed with patient risks of initiating opioids, dizziness, increased falls, addictive properties.  Recommended higher dose Tylenol and ibuprofen and to follow-up in 2 weeks.  Also offered sports medicine referral for possible ultrasound at that time.  Patient declined recommendations. Follow-up with PCP if worsens.

## 2022-04-15 ENCOUNTER — Ambulatory Visit: Payer: Medicare Other | Admitting: Family Medicine

## 2022-04-21 ENCOUNTER — Other Ambulatory Visit: Payer: Self-pay | Admitting: Family Medicine

## 2022-04-23 ENCOUNTER — Other Ambulatory Visit: Payer: Self-pay | Admitting: Family Medicine

## 2022-04-23 DIAGNOSIS — E119 Type 2 diabetes mellitus without complications: Secondary | ICD-10-CM

## 2022-04-29 ENCOUNTER — Telehealth: Payer: Self-pay | Admitting: Pharmacist

## 2022-04-29 ENCOUNTER — Encounter: Payer: Self-pay | Admitting: Pharmacist

## 2022-04-29 DIAGNOSIS — Z87891 Personal history of nicotine dependence: Secondary | ICD-10-CM

## 2022-04-29 NOTE — Telephone Encounter (Signed)
Noted and agree. 

## 2022-04-29 NOTE — Assessment & Plan Note (Signed)
Patient contacted for follow/up of successful tobacco cessation three months ago.   Since last contact patient reports continued abstinence.  Medications currently being used; NONE - off all therapy since June.   Patient reports thinking about cigarettes daily but "still not smoking any"  Continues to rate CONFIDENCE of quitting tobacco as high.    Total time with patient call and documentation of interaction: 8 minutes. Follow-up phone call planned: 3 more months (6 month quit date)

## 2022-04-29 NOTE — Telephone Encounter (Signed)
Patient contacted for follow/up of successful tobacco cessation three months ago.   Since last contact patient reports continued abstinence.  Medications currently being used; NONE - off all therapy since June.   Patient reports thinking about cigarettes daily but "still not smoking any"  Continues to rate CONFIDENCE of quitting tobacco as high.    Total time with patient call and documentation of interaction: 8 minutes. Follow-up phone call planned: 3 more months (6 month quit date)

## 2022-04-29 NOTE — Telephone Encounter (Signed)
-----   Message from Leavy Cella, [place] sent at 02/18/2022  3:21 PM EDT ----- Regarding: Quit - 3 months or more  (off therapy in June)

## 2022-05-20 ENCOUNTER — Other Ambulatory Visit: Payer: Self-pay | Admitting: Family Medicine

## 2022-05-25 ENCOUNTER — Telehealth: Payer: Self-pay | Admitting: Student

## 2022-05-25 DIAGNOSIS — E119 Type 2 diabetes mellitus without complications: Secondary | ICD-10-CM

## 2022-05-25 MED ORDER — ATORVASTATIN CALCIUM 20 MG PO TABS
20.0000 mg | ORAL_TABLET | Freq: Every day | ORAL | 2 refills | Status: DC
Start: 2022-05-25 — End: 2022-10-02

## 2022-05-25 NOTE — Telephone Encounter (Signed)
Patient came in stating that he needs a refill on his Atorvastatin please

## 2022-06-08 ENCOUNTER — Encounter (HOSPITAL_COMMUNITY): Payer: Self-pay

## 2022-06-08 ENCOUNTER — Other Ambulatory Visit: Payer: Self-pay

## 2022-06-08 ENCOUNTER — Emergency Department (HOSPITAL_COMMUNITY)
Admission: EM | Admit: 2022-06-08 | Discharge: 2022-06-08 | Discharge status: Against Medical Advice | Payer: Medicare Other | Attending: Emergency Medicine | Admitting: Emergency Medicine

## 2022-06-08 DIAGNOSIS — K5900 Constipation, unspecified: Secondary | ICD-10-CM

## 2022-06-08 DIAGNOSIS — R109 Unspecified abdominal pain: Secondary | ICD-10-CM | POA: Diagnosis present

## 2022-06-08 LAB — CBC
HCT: 45.9 % (ref 39.0–52.0)
Hemoglobin: 14.9 g/dL (ref 13.0–17.0)
MCH: 27.4 pg (ref 26.0–34.0)
MCHC: 32.5 g/dL (ref 30.0–36.0)
MCV: 84.4 fL (ref 80.0–100.0)
Platelets: 180 10*3/uL (ref 150–400)
RBC: 5.44 MIL/uL (ref 4.22–5.81)
RDW: 14.3 % (ref 11.5–15.5)
WBC: 7.3 10*3/uL (ref 4.0–10.5)
nRBC: 0 % (ref 0.0–0.2)

## 2022-06-08 LAB — COMPREHENSIVE METABOLIC PANEL
ALT: 21 U/L (ref 0–44)
AST: 42 U/L — ABNORMAL HIGH (ref 15–41)
Albumin: 3.5 g/dL (ref 3.5–5.0)
Alkaline Phosphatase: 67 U/L (ref 38–126)
Anion gap: 10 (ref 5–15)
BUN: 17 mg/dL (ref 8–23)
CO2: 22 mmol/L (ref 22–32)
Calcium: 10.1 mg/dL (ref 8.9–10.3)
Chloride: 102 mmol/L (ref 98–111)
Creatinine, Ser: 1.3 mg/dL — ABNORMAL HIGH (ref 0.61–1.24)
GFR, Estimated: >60 mL/min (ref >60)
Glucose, Bld: 227 mg/dL — ABNORMAL HIGH (ref 70–99)
Potassium: 4.3 mmol/L (ref 3.5–5.1)
Sodium: 134 mmol/L — ABNORMAL LOW (ref 135–145)
Total Bilirubin: 0.6 mg/dL (ref 0.3–1.2)
Total Protein: 7.6 g/dL (ref 6.5–8.1)

## 2022-06-08 LAB — TYPE AND SCREEN
ABO/RH(D): A POS
Antibody Screen: NEGATIVE

## 2022-06-08 LAB — LIPASE, BLOOD: Lipase: 32 U/L (ref 11–51)

## 2022-06-08 NOTE — ED Triage Notes (Signed)
Reports he took a lot of laxatives and metamucil and his stool is black.  Having abd pain.

## 2022-06-08 NOTE — ED Notes (Signed)
Pt found by staff wandering the hallways of the hospital. Pt brought back to ED by this staff. Patient irritated that the doctor did not come back in the room soon enough. Pt desires to leave. Arm band cut off by ED staff and patient ambulatory out of ED.

## 2022-06-08 NOTE — ED Provider Notes (Signed)
[facility] EMERGENCY DEPARTMENT Provider Note   CSN: [id number] Arrival date & time: 06/08/22  4431     History  Chief Complaint  Patient presents with   Abdominal Pain    [name] is a 66 y.o. male.   Abdominal Pain  Patient presents with abdominal pain and black stool.  States he had been on a bunch of laxatives because he was having difficulty with bowel movements.  States his PCP had ordered this.  States he did have a bowel movement a couple days ago and it was black.  No real abdominal pain.  States did not take Pepto-Bismol.  States really not having much pain with it.  States he is worried because it is black and because he has been having difficulty having bowel movements.  States that is taken the medicines and after that he will not have one for another few days.  No lightheadedness or dizziness.  Not on blood thinners.    Home Medications Prior to Admission medications   Medication Sig Start Date End Date Taking? Authorizing Provider  atorvastatin (LIPITOR) 20 MG tablet Take 1 tablet (20 mg total) by mouth daily. 05/25/22 02/19/23  Darci Current, DO  Blood Pressure KIT Check blood pressure 2-3 time a week 07/15/21   Carollee Leitz, MD  lactulose (CONSTULOSE) 10 GM/15ML solution TAKE 15 MLS BY MOUTH DAILY AS NEEDED FOR MILD CONSTIPATION. 02/16/22   Carollee Leitz, MD  losartan (COZAAR) 100 MG tablet Take 1 tablet (100 mg total) by mouth at bedtime. 02/26/22   Carollee Leitz, MD  metFORMIN (GLUCOPHAGE) 500 MG tablet TAKE 1 TABLET BY MOUTH TWICE DAILY WITH A MEAL 04/21/22   Darci Current, DO  sildenafil (VIAGRA) 50 MG tablet TAKE 1 TABLET BY MOUTH ONCE DAILY AS NEEDED FOR ERECTILE DYSFUNCTION 05/21/22   Darci Current, DO      Allergies    Patient has no known allergies.    Review of Systems   Review of Systems  Gastrointestinal:  Positive for abdominal pain.    Physical Exam Updated Vital Signs BP (!) 144/84 (BP Location: Right Arm)   Pulse 72   Temp 98.3  F (36.8 C) (Oral)   Resp 18   Ht 6' 2"  (1.88 m)   Wt 86.2 kg   SpO2 95%   BMI 24.39 kg/m  Physical Exam Vitals and nursing note reviewed.  Cardiovascular:     Rate and Rhythm: Normal rate.  Pulmonary:     Breath sounds: Normal breath sounds.  Abdominal:     Tenderness: There is no abdominal tenderness.     Hernia: No hernia is present.  Skin:    General: Skin is warm.     Capillary Refill: Capillary refill takes less than 2 seconds.  Neurological:     Mental Status: He is alert and oriented to person, place, and time.     ED Results / Procedures / Treatments   Labs (all labs ordered are listed, but only abnormal results are displayed) Labs Reviewed  COMPREHENSIVE METABOLIC PANEL - Abnormal; Notable for the following components:      Result Value   Sodium 134 (*)    Glucose, Bld 227 (*)    Creatinine, Ser 1.30 (*)    AST 42 (*)    All other components within normal limits  CBC  LIPASE, BLOOD  POC OCCULT BLOOD, ED  TYPE AND SCREEN    EKG None  Radiology No results found.  Procedures Procedures  Medications Ordered in ED Medications - No data to display  ED Course/ Medical Decision Making/ A&P                           Medical Decision Making Amount and/or Complexity of Data Reviewed Labs: ordered. Radiology: ordered.   Patient with constipation and black stool.  Mild abdominal tenderness.  Really not impressive exam.  Has been taking laxatives.  Unsure what he took with potentially lactulose.  Reportedly became black stool with that.  Blood work reassuring.  Plan was to get acute abdominal series to judge amount of stool and also get Hemoccult to evaluate for blood however patient eloped before this was done.        Final Clinical Impression(s) / ED Diagnoses Final diagnoses:  Constipation, unspecified constipation type    Rx / DC Orders ED Discharge Orders     None         Davonna Belling, MD 06/08/22 [phone]

## 2022-06-09 ENCOUNTER — Ambulatory Visit: Payer: Medicare Other | Admitting: Family Medicine

## 2022-06-09 NOTE — Patient Instructions (Addendum)
It was wonderful to see you today.  Today we talked about:  -I am sending a prescription for magnesium citrate.  You should drink this and that should help with your constipation. -Another option that I have sent is a fleets enema.  You will have to insert the solution rectally. -Continue to stay well-hydrated and increase your fiber intake to help with regular bowel movements. -Once you are able to have bowel movements, you can continue taking MiraLAX daily to stay regular.  You will need to mix a capful into an 8 ounce glass of water. You can take once or twice daily.  -I have refilled your losartan.  Thank you for choosing [facility].   Please call [phone] with any questions about today's appointment.  Please be sure to schedule follow up at the front  desk before you leave today.   Sharion Settler, DO PGY-3 Family Medicine

## 2022-06-09 NOTE — Progress Notes (Signed)
    SUBJECTIVE:   CHIEF COMPLAINT / HPI:   [name] is a 66 y.o. male who presents to the [facility] clinic today to discuss the following concerns:   Abdominal Pain, constipation Was seen in the emergency department on 9/25 for abdominal pain and black stool.  Had been using several laxatives to help with constipation.  Exam was not impressive, labs (CBC, CMP, lipase) were reassuring.  Per note, the plan was to get abdominal series and Hemoccult but patient had eloped before this was obtained.  PERTINENT  PMH / PSH: Hypertension, type 2 diabetes, hyperlipidemia, ED, history of tobacco use  OBJECTIVE:   There were no vitals taken for this visit. ***  General: NAD, pleasant, able to participate in exam Respiratory: Normal work of breathing on room air Abdomen: Bowel sounds present, nontender, nondistended, no hepatosplenomegaly. *** Psych: Normal affect and mood  ASSESSMENT/PLAN:   No problem-specific Assessment & Plan notes found for this encounter.     Sharion Settler, [place]

## 2022-06-10 ENCOUNTER — Ambulatory Visit (INDEPENDENT_AMBULATORY_CARE_PROVIDER_SITE_OTHER): Payer: Medicare Other | Admitting: Family Medicine

## 2022-06-10 ENCOUNTER — Encounter: Payer: Self-pay | Admitting: Family Medicine

## 2022-06-10 VITALS — BP 138/80 | HR 57 | Ht 74.0 in | Wt 179.0 lb

## 2022-06-10 DIAGNOSIS — I10 Essential (primary) hypertension: Secondary | ICD-10-CM | POA: Diagnosis not present

## 2022-06-10 DIAGNOSIS — K5900 Constipation, unspecified: Secondary | ICD-10-CM | POA: Diagnosis not present

## 2022-06-10 MED ORDER — MAGNESIUM CITRATE PO SOLN: 1.0000 | Freq: Once | ORAL | 0 refills | Status: AC

## 2022-06-10 MED ORDER — LOSARTAN POTASSIUM 100 MG PO TABS: 100.0000 mg | ORAL_TABLET | Freq: Every day | ORAL | 3 refills | Status: DC

## 2022-06-10 MED ORDER — FLEET ENEMA 7-19 GM/118ML RE ENEM: 1.0000 | ENEMA | Freq: Once | RECTAL | 0 refills | Status: AC

## 2022-06-10 NOTE — Assessment & Plan Note (Signed)
Had ran out of his losartan.  BP slightly above goal today. -Refill losartan

## 2022-06-10 NOTE — Assessment & Plan Note (Signed)
Acute problem, ongoing only for the last week.  Last bowel movement approximately 5 days ago.  Though he reports it was black, I have low suspicion for melena at this time given that blood is typically cathartic and he has been unable to go.  Blood work in the ED was all reassuring.  Renal function slightly above baseline but not AKI.  Low suspicion for any other more serious intra-abdominal process.  He has no history of abdominal surgeries, has been passing gas.  Abdominal exam was benign without any rebound or guarding, nontender in all quadrants.   -Trial magnesium citrate -Also sent in prescription for fleets enema should he want to try this as well -Once he is able to have some bowel movements, instructed to take MiraLAX to stay regular -Should also increase fiber intake -Return precautions provided

## 2022-06-17 NOTE — Progress Notes (Unsigned)
    SUBJECTIVE:   CHIEF COMPLAINT / HPI: [name] is a 66 y.o. male coming in for 60-monthfollow-up for diabetes.   T2DM: He is doing well on his medication with no complaints. Medication: 500 mg Metformin BID Home sugars: 100-120 fasting  Eye exam: overdue 09/2021 Last A1C: 6.5, 02/2022  He was recently seen in the office for constipation.  He has been doing better with lactulose as needed.  PERTINENT  PMH / PSH: HTN, T2DM, HLD, ED, hx tobacco abuse Repeat colonoscopy due 02/2024 AAA screening: 02/2022 neg CT lung screening: 02/2022 neg, repeat in 12 months   OBJECTIVE:   BP 129/84   Pulse 63   Ht '6\' 2"'$  (1.88 m)   Wt 180 lb (81.6 kg)   SpO2 99%   BMI 23.11 kg/m   Well-appearing, no acute distress Cardio: Regular rate, regular rhythm, no murmurs on exam. Pulm: Clear, no wheezing, no crackles. No increased work of breathing Abdominal: bowel sounds present, soft, non-tender, non-distended Extremities: no peripheral edema    ASSESSMENT/PLAN:   Hypertension Well-controlled.  Continue 100 mg losartan.  BMP WNL in the last 3 months. - CMP ordered for mildly elevated creatinine and AST 10 days ago.  Type 2 diabetes mellitus without complication (HCC) AH4R6.8.  He is well controlled on metformin 500 mg twice daily. - Patient will schedule an eye exam follow-up - Recheck A1c in 3 months - Refill metformin  Health care maintenance Patient is due for flu and shingles vaccine.  He is not interested in vaccines at this time but was informed on how to get them at his local pharmacy.  Erectile dysfunction Stable.  Medication refilled  History of tobacco use Continues to smoke intermittently.  Reports approximately 2 cigarettes daily. - Patient encouraged to continue to cut down     EDarci Current [place]

## 2022-06-18 ENCOUNTER — Encounter: Payer: Self-pay | Admitting: Student

## 2022-06-18 ENCOUNTER — Other Ambulatory Visit: Payer: Self-pay | Admitting: Family Medicine

## 2022-06-18 ENCOUNTER — Telehealth: Payer: Self-pay | Admitting: Student

## 2022-06-18 ENCOUNTER — Ambulatory Visit (INDEPENDENT_AMBULATORY_CARE_PROVIDER_SITE_OTHER): Payer: Medicare Other | Admitting: Student

## 2022-06-18 VITALS — BP 129/84 | HR 63 | Ht 74.0 in | Wt 180.0 lb

## 2022-06-18 DIAGNOSIS — Z87891 Personal history of nicotine dependence: Secondary | ICD-10-CM

## 2022-06-18 DIAGNOSIS — N529 Male erectile dysfunction, unspecified: Secondary | ICD-10-CM

## 2022-06-18 DIAGNOSIS — K5900 Constipation, unspecified: Secondary | ICD-10-CM

## 2022-06-18 DIAGNOSIS — I10 Essential (primary) hypertension: Secondary | ICD-10-CM

## 2022-06-18 DIAGNOSIS — E119 Type 2 diabetes mellitus without complications: Secondary | ICD-10-CM | POA: Diagnosis not present

## 2022-06-18 DIAGNOSIS — Z0000 Encounter for general adult medical examination without abnormal findings: Secondary | ICD-10-CM

## 2022-06-18 LAB — POCT GLYCOSYLATED HEMOGLOBIN (HGB A1C): HbA1c, POC (controlled diabetic range): 6.8 % (ref 0.0–7.0)

## 2022-06-18 MED ORDER — SILDENAFIL CITRATE 50 MG PO TABS: 50.0000 mg | ORAL_TABLET | Freq: Every day | ORAL | 0 refills | Status: DC | PRN

## 2022-06-18 MED ORDER — LACTULOSE 10 GM/15ML PO SOLN: ORAL | 1 refills | Status: DC

## 2022-06-18 MED ORDER — METFORMIN HCL 500 MG PO TABS: 500.0000 mg | ORAL_TABLET | Freq: Two times a day (BID) | ORAL | 0 refills | Status: DC

## 2022-06-18 NOTE — Telephone Encounter (Signed)
Called [name] to rediscuss his constipation/melanotic stools and to recommend referral to GI.  Patient states recent ED visit for constipation was due to eating foods that made him constipated and that his symptoms have now resolved.  He does not want to go to a GI doctor at this time.  Rediscuss at next visit GI referral and recheck CBC.  Patient is scheduled to follow-up in 3 months.  Colonoscopy is up-to-date and due in 2025 but with persistent symptoms and multiple polyps seen on last scope, may benefit from additional colonoscopy.  Darci Current, DO [facility], PGY-1 06/18/22 9:28 AM

## 2022-06-18 NOTE — Patient Instructions (Addendum)
It was great to see you today! Thank you for choosing [facility] for your primary care.   Today we addressed: Your diabetes, your A1C was 6.8.  Would like for you to follow-up in 3 months. I like to recheck your kidney and liver function panel today. Please go see your eye doctor for diabetic eye screening.  You are due for flu and shingles vaccine.  You can go to your pharmacy to obtain these.   If you haven't already, sign up for My Chart to have easy access to your labs results, and communication with your primary care physician.  We are checking some labs today. If they are abnormal, I will call you. If they are normal, I will send you a MyChart message (if it is active) or a letter in the mail. If you do not hear about your labs in the next 2 weeks, please call the office.   You should return to our clinic Return in about 3 months (around 09/18/2022).  I recommend that you always bring your medications to each appointment as this makes it easy to ensure you are on the correct medications and helps [place] not miss refills when you need them.  Please arrive 15 minutes before your appointment to ensure smooth check in process.  We appreciate your efforts in making this happen.  Please call the clinic at [phone] if your symptoms worsen or you have any concerns.  Thank you for allowing me to participate in your care, Dr. Sabra Heck

## 2022-06-18 NOTE — Assessment & Plan Note (Signed)
Continues to smoke intermittently.  Reports approximately 2 cigarettes daily. - Patient encouraged to continue to cut down

## 2022-06-18 NOTE — Assessment & Plan Note (Signed)
Stable.  Medication refilled

## 2022-06-18 NOTE — Assessment & Plan Note (Addendum)
A1c 6.8.  He is well controlled on metformin 500 mg twice daily. - Patient will schedule an eye exam follow-up - Recheck A1c in 3 months - Refill metformin

## 2022-06-18 NOTE — Assessment & Plan Note (Signed)
Patient is due for flu and shingles vaccine.  He is not interested in vaccines at this time but was informed on how to get them at his local pharmacy.

## 2022-06-18 NOTE — Assessment & Plan Note (Signed)
Well-controlled.  Continue 100 mg losartan.  BMP WNL in the last 3 months. - CMP ordered for mildly elevated creatinine and AST 10 days ago.

## 2022-06-19 ENCOUNTER — Other Ambulatory Visit: Payer: Medicare Other

## 2022-06-22 ENCOUNTER — Other Ambulatory Visit: Payer: Medicare Other

## 2022-06-22 DIAGNOSIS — E119 Type 2 diabetes mellitus without complications: Secondary | ICD-10-CM

## 2022-06-23 ENCOUNTER — Encounter: Payer: Self-pay | Admitting: Family Medicine

## 2022-06-23 LAB — COMPREHENSIVE METABOLIC PANEL
ALT: 12 IU/L (ref 0–44)
AST: 18 IU/L (ref 0–40)
Albumin/Globulin Ratio: 1.4 (ref 1.2–2.2)
Albumin: 4 g/dL (ref 3.9–4.9)
Alkaline Phosphatase: 67 IU/L (ref 44–121)
BUN/Creatinine Ratio: 15 (ref 10–24)
BUN: 14 mg/dL (ref 8–27)
Bilirubin Total: 0.3 mg/dL (ref 0.0–1.2)
CO2: 21 mmol/L (ref 20–29)
Calcium: 10 mg/dL (ref 8.6–10.2)
Chloride: 105 mmol/L (ref 96–106)
Creatinine, Ser: 0.95 mg/dL (ref 0.76–1.27)
Globulin, Total: 2.9 g/dL (ref 1.5–4.5)
Glucose: 119 mg/dL — ABNORMAL HIGH (ref 70–99)
Potassium: 4.8 mmol/L (ref 3.5–5.2)
Sodium: 142 mmol/L (ref 134–144)
Total Protein: 6.9 g/dL (ref 6.0–8.5)
eGFR: 88 mL/min/{1.73_m2} (ref >59)

## 2022-06-23 NOTE — Progress Notes (Signed)
Normal CMP

## 2022-07-27 ENCOUNTER — Telehealth: Payer: Self-pay | Admitting: Pharmacist

## 2022-07-27 NOTE — Telephone Encounter (Signed)
Patient contacted for follow/up of tobacco cessation achieved 6 months ago.    Since last contact patient reports continued abstinence from smoking  Medications currently being used; None X > 3 months.   Continues to rate CONFIDENCE of staying quit from tobacco as high.   Total time with patient call and documentation of interaction: 3 minutes. Follow-up phone call planned: at 12 months quit - 6 months from now.

## 2022-08-17 ENCOUNTER — Other Ambulatory Visit: Payer: Self-pay | Admitting: Student

## 2022-08-17 DIAGNOSIS — E119 Type 2 diabetes mellitus without complications: Secondary | ICD-10-CM

## 2022-09-21 ENCOUNTER — Encounter: Payer: Self-pay | Admitting: Family Medicine

## 2022-10-02 ENCOUNTER — Ambulatory Visit (INDEPENDENT_AMBULATORY_CARE_PROVIDER_SITE_OTHER): Payer: 59 | Admitting: Student

## 2022-10-02 ENCOUNTER — Encounter: Payer: Self-pay | Admitting: Student

## 2022-10-02 VITALS — BP 126/64 | HR 64 | Ht 74.0 in | Wt 187.0 lb

## 2022-10-02 DIAGNOSIS — N529 Male erectile dysfunction, unspecified: Secondary | ICD-10-CM | POA: Diagnosis not present

## 2022-10-02 DIAGNOSIS — E119 Type 2 diabetes mellitus without complications: Secondary | ICD-10-CM

## 2022-10-02 DIAGNOSIS — K5900 Constipation, unspecified: Secondary | ICD-10-CM

## 2022-10-02 DIAGNOSIS — E785 Hyperlipidemia, unspecified: Secondary | ICD-10-CM

## 2022-10-02 DIAGNOSIS — I10 Essential (primary) hypertension: Secondary | ICD-10-CM

## 2022-10-02 LAB — POCT GLYCOSYLATED HEMOGLOBIN (HGB A1C): HbA1c, POC (controlled diabetic range): 6.1 % (ref 0.0–7.0)

## 2022-10-02 MED ORDER — LOSARTAN POTASSIUM 100 MG PO TABS: 100.0000 mg | ORAL_TABLET | Freq: Every day | ORAL | 3 refills | Status: DC

## 2022-10-02 MED ORDER — METFORMIN HCL 500 MG PO TABS: 500.0000 mg | ORAL_TABLET | Freq: Two times a day (BID) | ORAL | 2 refills | Status: DC

## 2022-10-02 MED ORDER — LACTULOSE 10 GM/15ML PO SOLN: ORAL | 1 refills | Status: DC

## 2022-10-02 MED ORDER — ATORVASTATIN CALCIUM 20 MG PO TABS: 20.0000 mg | ORAL_TABLET | Freq: Every day | ORAL | 2 refills | Status: DC

## 2022-10-02 MED ORDER — SILDENAFIL CITRATE 50 MG PO TABS: 50.0000 mg | ORAL_TABLET | Freq: Every day | ORAL | 0 refills | Status: DC | PRN

## 2022-10-02 NOTE — Progress Notes (Signed)
    SUBJECTIVE:   CHIEF COMPLAINT / HPI:   [name] is a 67 y.o. male  presenting for 3 month follow up for his diabetes and HTN.   Diabetic Follow Up:  Home medications include: metformin 500 mg BID.  Patient endorses taking these medications as prescribed. Most recent A1Cs:  Lab Results  Component Value Date   HGBA1C 6.1 10/02/2022  Diabetic eye exam: due  Blood pressure: taking his medications as prescribed. No side effects from the medications.   HLD: compliant with medications, denies side effects   PERTINENT  PMH / PSH: HTN, T2DM, bilateral conductive hearing loss  OBJECTIVE:   BP 126/64   Pulse 64   Ht '6\' 2"'$  (1.88 m)   Wt 187 lb (84.8 kg)   SpO2 99%   BMI 24.01 kg/m   Well-appearing, no acute distress Cardio: Regular rate, regular rhythm, no murmurs on exam. Pulm: Clear, no wheezing, no crackles. No increased work of breathing Abdominal: bowel sounds present, soft, non-tender, non-distended Extremities: no peripheral edema    ASSESSMENT/PLAN:   Type 2 diabetes mellitus without complication (HCC) M2T 6.2 - cont metformin 500 mg BID - due for eye exam, encourage patient to see optho  - refilled medications   Hypertension Well controlled - continue 100 mg Losartan  - check BMP at next visit  - refilled medications  Hyperlipidemia Stable - refill lipitor 20 mg - check lipid panel at next visit    At next visit encourage Zoster vaccine and Tdap   Darci Current, [place]

## 2022-10-02 NOTE — Assessment & Plan Note (Addendum)
A1C 6.2 - cont metformin 500 mg BID - due for eye exam, encourage patient to see optho  - refilled medications

## 2022-10-02 NOTE — Assessment & Plan Note (Addendum)
Well controlled - continue 100 mg Losartan  - check BMP at next visit  - refilled medications

## 2022-10-02 NOTE — Assessment & Plan Note (Signed)
Stable - refill lipitor 20 mg - check lipid panel at next visit

## 2022-10-23 IMAGING — CT CT CHEST LUNG CANCER SCREENING LOW DOSE W/O CM
1 of 2 series · 15 of 32 positions shown, 19 images · non-contrast
Comparison: 09/17/2018 chest radiograph.

CLINICAL DATA: Twenty pack-year smoking history.  Current smoker.

EXAM:
CT CHEST WITHOUT CONTRAST LOW-DOSE FOR LUNG CANCER SCREENING
TECHNIQUE: Multidetector CT imaging of the chest was performed following the
standard protocol without IV contrast.

[Series 3: ldct screen lung · axial · 0.74mm/px · z∈[-318,+12]mm · 15 of 364 slices shown, 19 images]
[im 17/364  mediastinal]
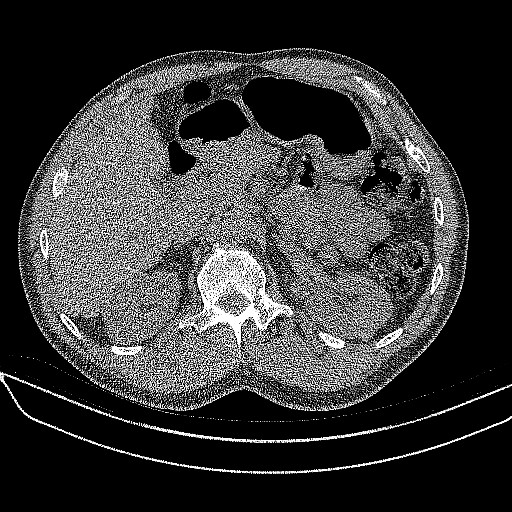
[im 17/364  lung]
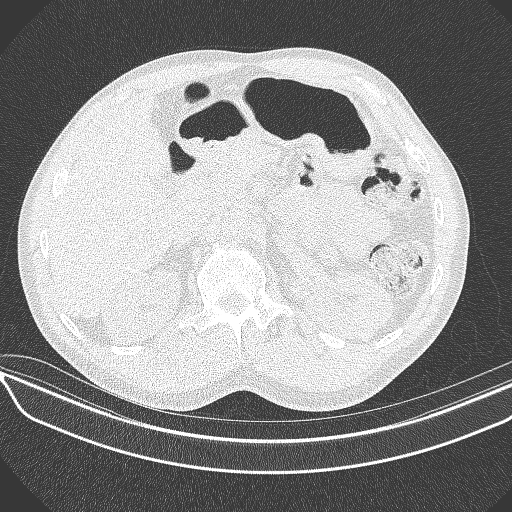
[im 50/364  lung]
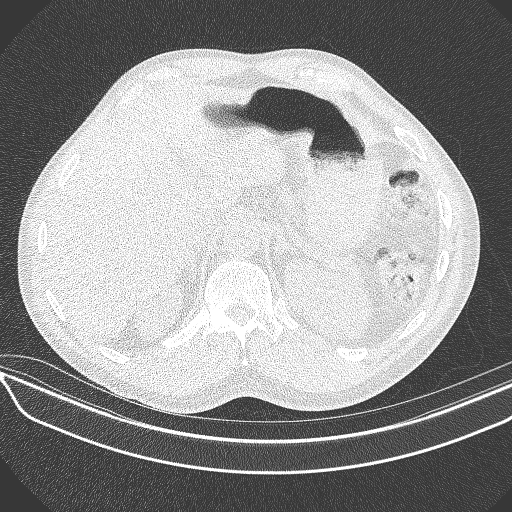
[im 83/364  lung]
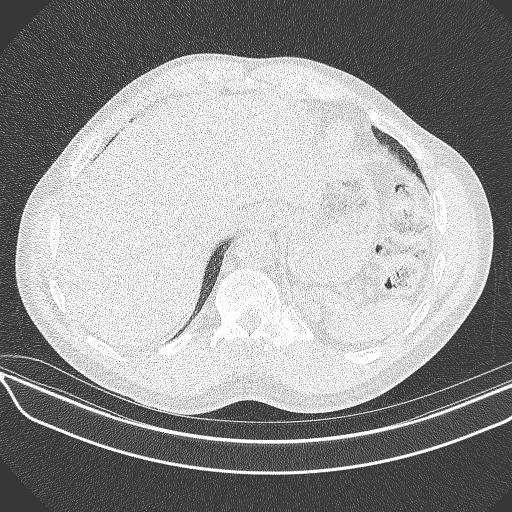
[im 91/364  lung]
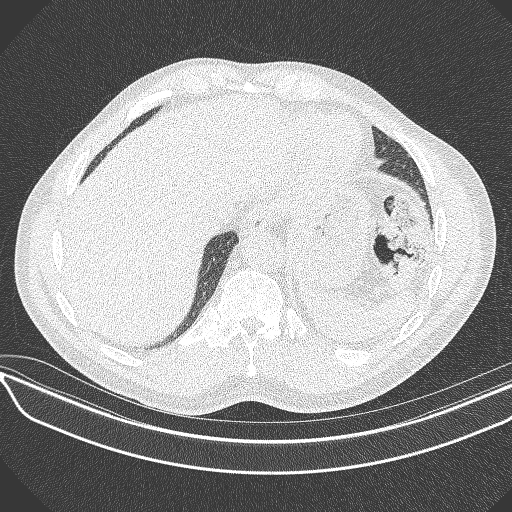
[im 116/364  mediastinal]
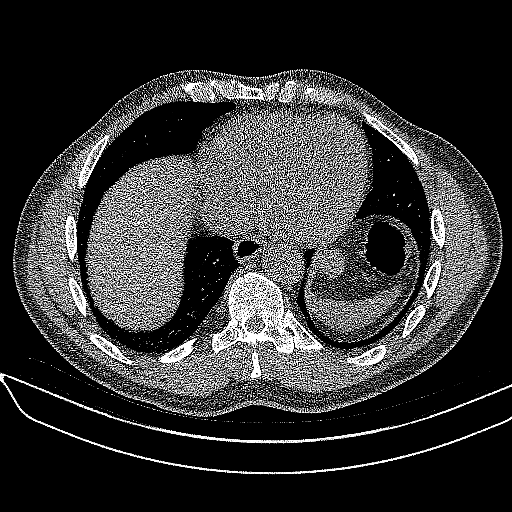
[im 116/364  lung]
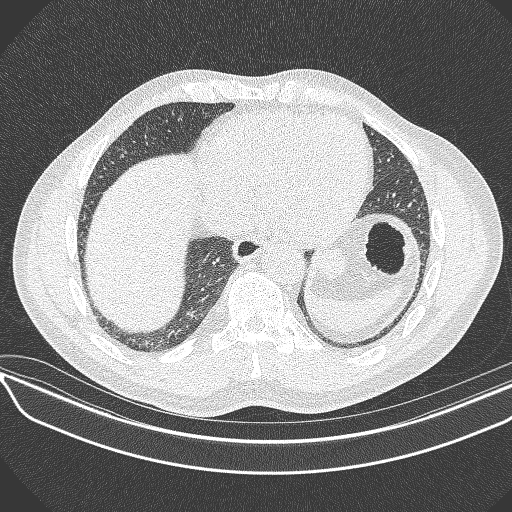
[im 133/364  lung]
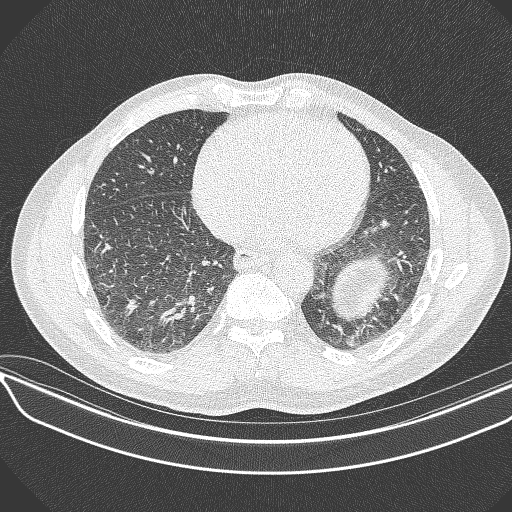
[im 166/364  lung]
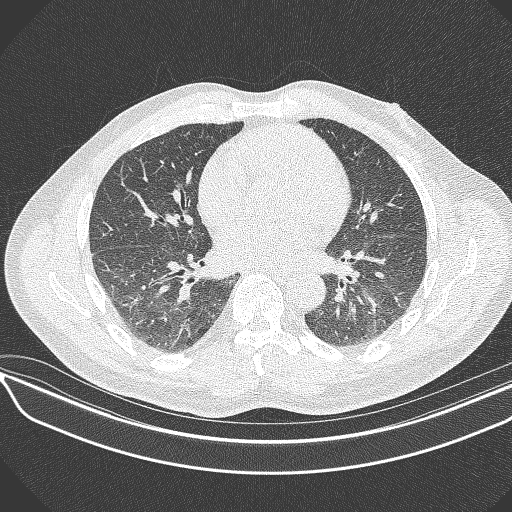
[im 182/364  lung]
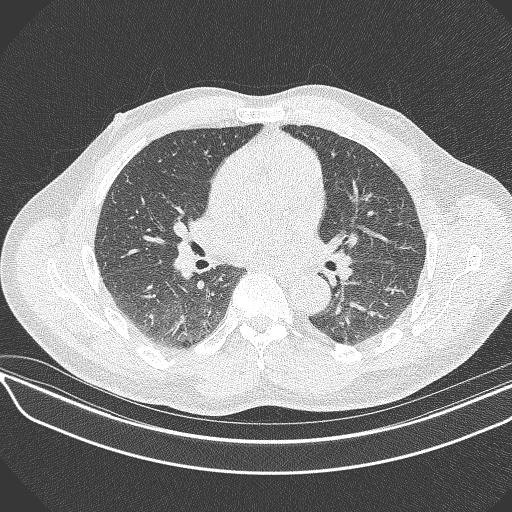
[im 199/364  mediastinal]
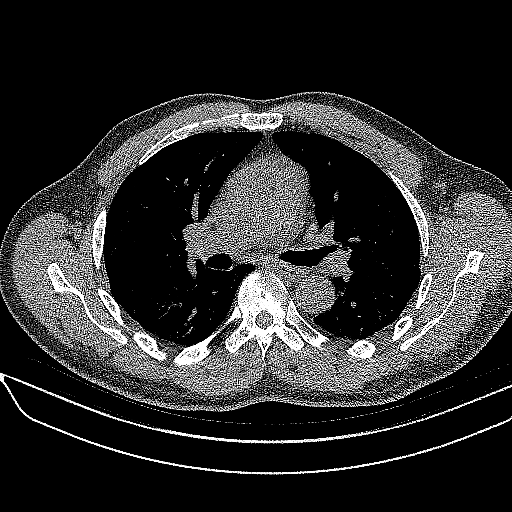
[im 199/364  lung]
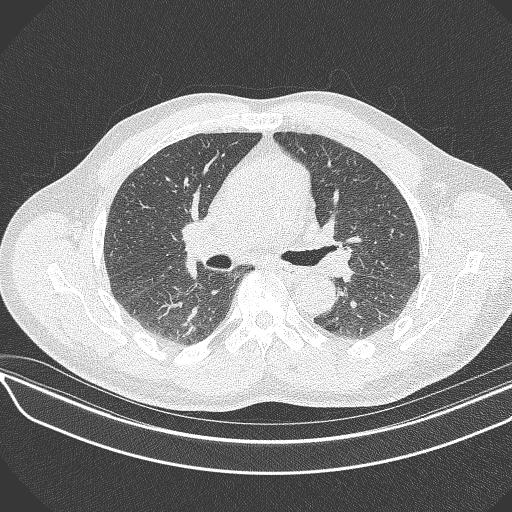
[im 232/364  lung]
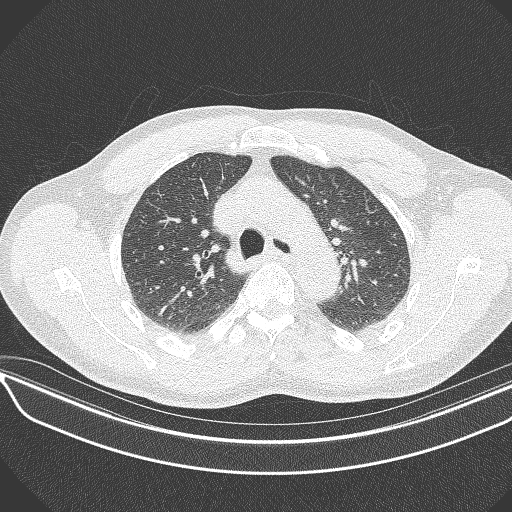
[im 248/364  lung]
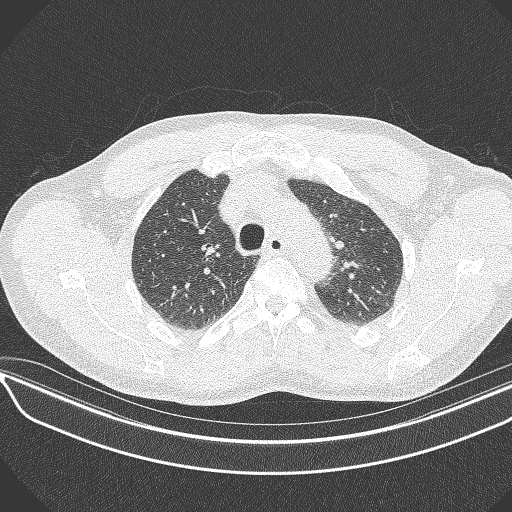
[im 273/364  lung]
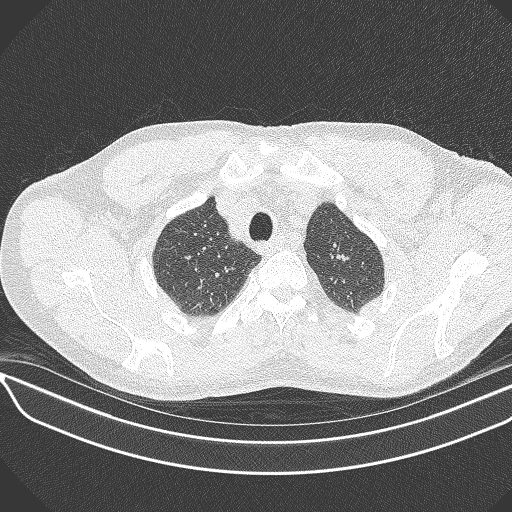
[im 298/364  mediastinal]
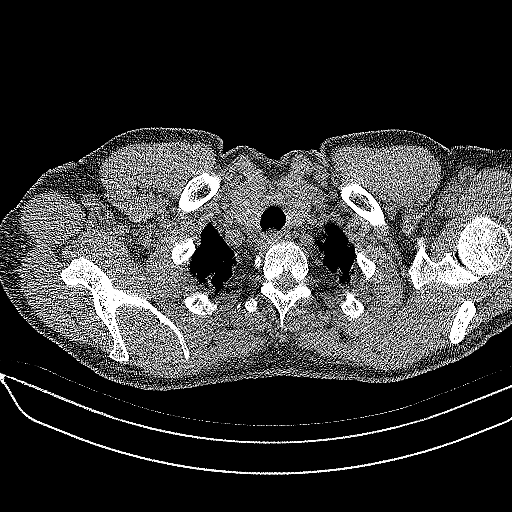
[im 298/364  lung]
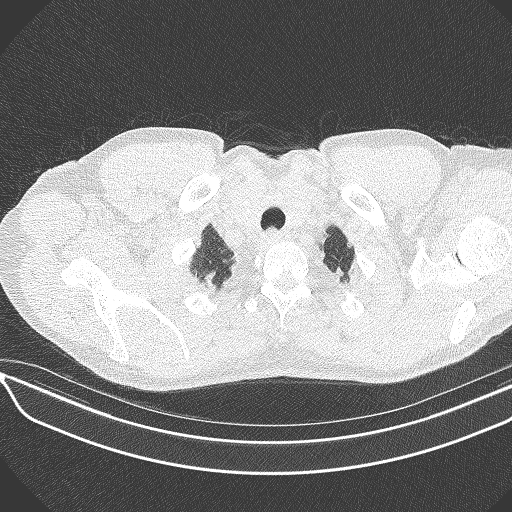
[im 314/364  lung]
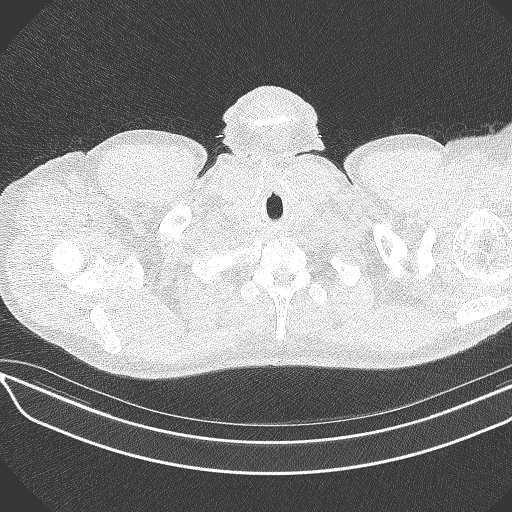
[im 347/364  lung]
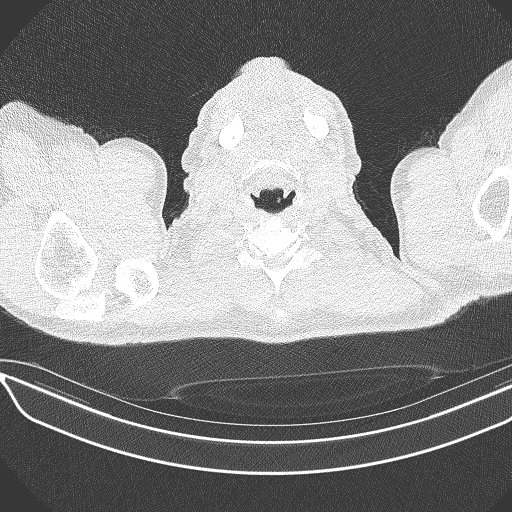

[15 of 32 positions shown; findings below may reference images not displayed]

FINDINGS: Cardiovascular: Normal aortic caliber. Normal heart size, without
pericardial effusion.

Mediastinum/Nodes: No mediastinal or definite hilar adenopathy,
given limitations of unenhanced CT.

Lungs/Pleura: No pleural fluid. Mild centrilobular emphysema. Mild
motion degradation inferiorly.

No suspicious pulmonary nodule or mass.

Upper Abdomen: Normal imaged portions of the liver, spleen, stomach,
pancreas, gallbladder, adrenal glands, kidneys.

Musculoskeletal: Mild S shaped thoracic spine curvature.
IMPRESSION: 1. Lung-RADS 1, negative. Continue annual screening with low-dose
chest CT without contrast in 12 months.
2. Emphysema ([id number]).

## 2022-12-07 ENCOUNTER — Telehealth: Payer: Self-pay | Admitting: Student

## 2022-12-07 NOTE — Telephone Encounter (Signed)
Contacted [name] to schedule their annual wellness visit. Appointment made for 12/15/2022.  Thank you,  [place] Direct dial  [phone]

## 2022-12-14 ENCOUNTER — Other Ambulatory Visit: Payer: Self-pay | Admitting: Student

## 2022-12-14 DIAGNOSIS — N529 Male erectile dysfunction, unspecified: Secondary | ICD-10-CM

## 2022-12-14 NOTE — Progress Notes (Unsigned)
I connected with  [name] on 12/15/2022 by a audio enabled telemedicine application and verified that I am speaking with the correct person using two identifiers.  Patient Location: Home  Provider Location: Home Office  I discussed the limitations of evaluation and management by telemedicine. The patient expressed understanding and agreed to proceed.   Subjective:   [name] is a 67 y.o. male who presents for an Initial Medicare Annual Wellness Visit.  Review of Systems    Per HPI unless specifically indicated below.  Cardiac Risk Factors include: advanced age (>17men, >56 women);male gender,  Hypertension, and Hyperlipidemia.          Objective:        10/02/2022    2:03 PM 06/18/2022    8:27 AM 06/10/2022    9:43 AM  Vitals with BMI  Height 6\' 2"  6\' 2"  6\' 2"   Weight 187 lbs 180 lbs 179 lbs  BMI 24 [id number]  Systolic 123XX123 [id number] 0000000  Diastolic 64 84 80  Pulse 64 63 57    There were no vitals filed for this visit. There is no height or weight on file to calculate BMI.     12/15/2022    9:03 AM 06/18/2022    8:26 AM 06/10/2022   10:07 AM 06/08/2022    8:37 AM 03/09/2022    1:52 PM 03/02/2022   10:48 AM 02/26/2022    8:38 AM  Advanced Directives  Does Patient Have a Medical Advance Directive? No No No No No No No  Would patient like information on creating a medical advance directive? No - Patient declined No - Patient declined  No - Patient declined No - Patient declined No - Patient declined No - Patient declined    Current Medications (verified) Outpatient Encounter Medications as of 12/15/2022  Medication Sig   atorvastatin (LIPITOR) 20 MG tablet Take 1 tablet (20 mg total) by mouth daily.   Blood Pressure KIT Check blood pressure 2-3 time a week   lactulose (CONSTULOSE) 10 GM/15ML solution TAKE 15 MLS BY MOUTH DAILY AS NEEDED FOR MILD CONSTIPATION.   losartan (COZAAR) 100 MG tablet Take 1 tablet (100 mg total) by mouth at bedtime.   metFORMIN  (GLUCOPHAGE) 500 MG tablet Take 1 tablet (500 mg total) by mouth 2 (two) times daily with a meal.   [DISCONTINUED] sildenafil (VIAGRA) 50 MG tablet Take 1 tablet (50 mg total) by mouth daily as needed for erectile dysfunction.   No facility-administered encounter medications on file as of 12/15/2022.    Allergies (verified) Patient has no known allergies.   History: Past Medical History:  Diagnosis Date   Arthritis 08/31/2018   Cough 08/31/2018   Diabetes mellitus without complication    Hav (hallux abducto valgus), unspecified laterality 07/23/2020   Hypercalcemia 01/24/2020   Hyperlipidemia    Weight loss, unintentional 01/20/2021   No past surgical history on file. Family History  Problem Relation Age of Onset   Diabetes Mother    Diabetes Brother    Colon polyps Neg Hx    Esophageal cancer Neg Hx    Rectal cancer Neg Hx    Stomach cancer Neg Hx    Social History   Socioeconomic History   Marital status: Divorced    Spouse name: Not on file   Number of children: 3   Years of education: Not on file   Highest education level: Not on file  Occupational History   Occupation: retired  Tobacco Use  Smoking status: Every Day    Packs/day: 0.50    Years: 46.00    Additional pack years: 0.00    Total pack years: 23.00    Types: Cigarettes    Start date: 09/15/1975    Last attempt to quit: 12/08/2021    Years since quitting: 1.0    Passive exposure: Past   Smokeless tobacco: Never   Tobacco comments:    Quit March 2023 - using Varenicline.  46 pack year hx of tobacco use - Off all tx and remains quit 04/29/2022  Vaping Use   Vaping Use: Never used  Substance and Sexual Activity   Alcohol use: Never   Drug use: Never   Sexual activity: Not Currently  Other Topics Concern   Not on file  Social History Narrative   Not on file   Social Determinants of Health   Financial Resource Strain: Low Risk  (12/15/2022)   Overall Financial Resource Strain (CARDIA)    Difficulty of  Paying Living Expenses: Not hard at all  Food Insecurity: No Food Insecurity (12/15/2022)   Hunger Vital Sign    Worried About Running Out of Food in the Last Year: Never true    Ran Out of Food in the Last Year: Never true  Transportation Needs: No Transportation Needs (12/15/2022)   PRAPARE - Hydrologist (Medical): No    Lack of Transportation (Non-Medical): No  Physical Activity: Sufficiently Active (12/15/2022)   Exercise Vital Sign    Days of Exercise per Week: 7 days    Minutes of Exercise per Session: 60 min  Stress: No Stress Concern Present (12/15/2022)   [place]    Feeling of Stress : Not at all  Social Connections: Moderately Integrated (12/15/2022)   Social Connection and Isolation Panel [NHANES]    Frequency of Communication with Friends and Family: More than three times a week    Frequency of Social Gatherings with Friends and Family: Twice a week    Attends Religious Services: More than 4 times per year    Active Member of [employer] or Organizations: Yes    Attends Archivist Meetings: Never    Marital Status: Divorced    Tobacco Counseling Ready to quit: Not Answered Counseling given: No Tobacco comments: Quit March 2023 - using Varenicline.  46 pack year hx of tobacco use - Off all tx and remains quit 04/29/2022   Clinical Intake:  Pre-visit preparation completed: No  Pain : No/denies pain     Nutritional Status: BMI 25 -29 Overweight Nutritional Risks: None Diabetes: Yes CBG done?: No Did pt. bring in CBG monitor from home?: No  How often do you need to have someone help you when you read instructions, pamphlets, or other written materials from your doctor or pharmacy?: 1 - Never  Diabetic?Nutrition Risk Assessment:  Has the patient had any N/V/D within the last 2 months?  No  Does the patient have any non-healing wounds?  No  Has the patient had any  unintentional weight loss or weight gain?  No   Diabetes:  Is the patient diabetic?  Yes  If diabetic, was a CBG obtained today?  No  Did the patient bring in their glucometer from home?  No  How often do you monitor your CBG's? Twice a week .   Financial Strains and Diabetes Management:  Are you having any financial strains with the device, your supplies or your medication?  No .  Does the patient want to be seen by Chronic Care Management for management of their diabetes?  No  Would the patient like to be referred to a Nutritionist or for Diabetic Management?  No   Diabetic Exams:  Diabetic Eye Exam: Overdue for diabetic eye exam. Pt has been advised about the importance in completing this exam. Patient advised to call and schedule an eye exam. Diabetic Foot Exam: Overdue, Pt has been advised about the importance in completing this exam. Pt is scheduled for diabetic foot exam on at next diabetic appt.   Interpreter Needed?: No  Information entered by :: Donnie [place], [place]   Activities of Daily Living    12/15/2022    8:53 AM  In your present state of health, do you have any difficulty performing the following activities:  Hearing? 1  Vision? 0  Difficulty concentrating or making decisions? 0  Walking or climbing stairs? 0  Dressing or bathing? 0  Doing errands, shopping? 0    Patient Care Team: Darci Current, DO as PCP - General (Family Medicine)  Indicate any recent Medical Services you may have received from other than [facility] providers in the past year (date may be approximate).     Assessment:   This is a routine wellness examination for [name].   Hearing/Vision screen Denies any hearing issues. Denies any change to her vision. Annual Eye Exam.   Dietary issues and exercise activities discussed: Current Exercise Habits: Structured exercise class, Type of exercise: walking, Time (Minutes): 60, Frequency (Times/Week): 7, Weekly Exercise (Minutes/Week): 420,  Intensity: Moderate, Exercise limited by: None identified   Goals Addressed   None    Depression Screen    12/15/2022    8:53 AM 06/18/2022    8:27 AM 03/09/2022    1:55 PM 03/02/2022   10:48 AM 02/26/2022    8:39 AM 02/16/2022    8:35 AM 10/03/2021   10:10 AM  PHQ 2/9 Scores  PHQ - 2 Score 0   0 0    PHQ- 9 Score    0 0    Exception Documentation  Patient refusal Patient refusal   Patient refusal Patient refusal    Fall Risk    12/15/2022    8:53 AM 10/02/2022    2:05 PM 06/18/2022    8:29 AM 06/10/2022   10:07 AM 03/09/2022    1:52 PM  [place] in the past year? 0 0 0 0 0  Number falls in past yr: 0    0  Injury with Fall? 0    0  Risk for fall due to : No Fall Risks      Follow up Falls evaluation completed        FALL RISK PREVENTION PERTAINING TO THE HOME:  Any stairs in or around the home? No  If so, are there any without handrails? No  Home free of loose throw rugs in walkways, pet beds, electrical cords, etc? Yes  Adequate lighting in your home to reduce risk of falls? Yes   ASSISTIVE DEVICES UTILIZED TO PREVENT FALLS:  Life alert? No  Use of a cane, walker or w/c? No  Grab bars in the bathroom? No  Shower chair or bench in shower? No  Elevated toilet seat or a handicapped toilet? No   TIMED UP AND GO:  Was the test performed? Unable to perform, virtual appointment   Cognitive Function:        12/15/2022  9:01 AM  6CIT Screen  What Year? 0 points  What month? 0 points  What time? 0 points  Count back from 20 0 points  Months in reverse 0 points  Repeat phrase 0 points  Total Score 0 points    Immunizations Immunization History  Administered Date(s) Administered   PFIZER(Purple Top)SARS-COV-2 Vaccination 04/26/2020, 05/17/2020, 10/17/2020    TDAP status: Due, Education has been provided regarding the importance of this vaccine. Advised may receive this vaccine at local pharmacy or Health Dept. Aware to provide a copy of the vaccination  record if obtained from local pharmacy or Health Dept. Verbalized acceptance and understanding.  Flu Vaccine status: Up to date  Pneumococcal vaccine status: Due, Education has been provided regarding the importance of this vaccine. Advised may receive this vaccine at local pharmacy or Health Dept. Aware to provide a copy of the vaccination record if obtained from local pharmacy or Health Dept. Verbalized acceptance and understanding.  Covid-19 vaccine status: Information provided on how to obtain vaccines.   Qualifies for Shingles Vaccine? Yes   Zostavax completed No   Shingrix Completed?: No.    Education has been provided regarding the importance of this vaccine. Patient has been advised to call insurance company to determine out of pocket expense if they have not yet received this vaccine. Advised may also receive vaccine at local pharmacy or Health Dept. Verbalized acceptance and understanding.  Screening Tests Health Maintenance  Topic Date Due   DTaP/Tdap/Td (1 - Tdap) Never done   Zoster Vaccines- Shingrix (1 of 2) Never done   OPHTHALMOLOGY EXAM  10/01/2021   COVID-19 Vaccine (4 - 2023-24 season) 05/15/2022   Diabetic kidney evaluation - Urine ACR  07/15/2022   FOOT EXAM  10/27/2022   Pneumonia Vaccine 59+ Years old (1 of 2 - PCV) 02/17/2023 (Originally 12/24/1961)   Lung Cancer Screening  02/28/2023   HEMOGLOBIN A1C  04/02/2023   INFLUENZA VACCINE  04/15/2023   Diabetic kidney evaluation - eGFR measurement  06/23/2023   Medicare Annual Wellness (AWV)  12/15/2023   COLONOSCOPY (Pts 45-17yrs Insurance coverage will need to be confirmed)  02/25/2024   Hepatitis C Screening  Completed   HPV VACCINES  Aged Out    Health Maintenance  Health Maintenance Due  Topic Date Due   DTaP/Tdap/Td (1 - Tdap) Never done   Zoster Vaccines- Shingrix (1 of 2) Never done   OPHTHALMOLOGY EXAM  10/01/2021   COVID-19 Vaccine (4 - 2023-24 season) 05/15/2022   Diabetic kidney evaluation - Urine  ACR  07/15/2022   FOOT EXAM  10/27/2022    Colorectal cancer screening: Type of screening: Colonoscopy. Completed 02/24/21. Repeat every 3 years  Lung Cancer Screening: (Low Dose CT Chest recommended if Age 55-80 years, 30 pack-year currently smoking OR have quit w/in 15years.) does qualify.   Lung Cancer Screening Referral: due 02/28/2023  Additional Screening:  Hepatitis C Screening: does qualify; Completed 10/30/2017  Vision Screening: Recommended annual ophthalmology exams for early detection of glaucoma and other disorders of the eye. Is the patient up to date with their annual eye exam?  No Who is the provider or what is the name of the office in which the patient attends annual eye exams?  If pt is not established with a provider, would they like to be referred to a provider to establish care? No .   Dental Screening: Recommended annual dental exams for proper oral hygiene  Community Resource Referral / Chronic Care Management: CRR required this visit?  No   CCM required this visit?  No      Plan:     I have personally reviewed and noted the following in the patient's chart:   Medical and social history Use of alcohol, tobacco or illicit drugs  Current medications and supplements including opioid prescriptions. Patient is not currently taking opioid prescriptions. Functional ability and status Nutritional status Physical activity Advanced directives List of other physicians Hospitalizations, surgeries, and ER visits in previous 12 months Vitals Screenings to include cognitive, depression, and falls Referrals and appointments  In addition, I have reviewed and discussed with patient certain preventive protocols, quality metrics, and best practice recommendations. A written personalized care plan for preventive services as well as general preventive health recommendations were provided to patient.    [name] , Thank you for taking time to come for your Medicare  Wellness Visit. I appreciate your ongoing commitment to your health goals. Please review the following plan we discussed and let me know if I can assist you in the future.   These are the goals we discussed:  Goals   None     This is a list of the screening recommended for you and due dates:  Health Maintenance  Topic Date Due   DTaP/Tdap/Td vaccine (1 - Tdap) Never done   Zoster (Shingles) Vaccine (1 of 2) Never done   Eye exam for diabetics  10/01/2021   COVID-19 Vaccine (4 - 2023-24 season) 05/15/2022   Yearly kidney health urinalysis for diabetes  07/15/2022   Complete foot exam   10/27/2022   Pneumonia Vaccine (1 of 2 - PCV) 02/17/2023*   Screening for Lung Cancer  02/28/2023   Hemoglobin A1C  04/02/2023   Flu Shot  04/15/2023   Yearly kidney function blood test for diabetes  06/23/2023   Medicare Annual Wellness Visit  12/15/2023   Colon Cancer Screening  02/25/2024   Hepatitis C Screening: USPSTF Recommendation to screen - Ages 18-79 yo.  Completed   HPV Vaccine  Aged Out  *Topic was postponed. The date shown is not the original due date.     Wilson Singer, [place]   12/15/2022  Nurse Notes: Approximately 30 minute Non-Face -To-Face Medicare Wellness Visit

## 2022-12-14 NOTE — Patient Instructions (Signed)
Health Maintenance, Male Adopting a healthy lifestyle and getting preventive care are important in promoting health and wellness. Ask your health care provider about: The right schedule for you to have regular tests and exams. Things you can do on your own to prevent diseases and keep yourself healthy. What should I know about diet, weight, and exercise? Eat a healthy diet  Eat a diet that includes plenty of vegetables, fruits, low-fat dairy products, and lean protein. Do not eat a lot of foods that are high in solid fats, added sugars, or sodium. Maintain a healthy weight Body mass index (BMI) is a measurement that can be used to identify possible weight problems. It estimates body fat based on height and weight. Your health care provider can help determine your BMI and help you achieve or maintain a healthy weight. Get regular exercise Get regular exercise. This is one of the most important things you can do for your health. Most adults should: Exercise for at least 150 minutes each week. The exercise should increase your heart rate and make you sweat (moderate-intensity exercise). Do strengthening exercises at least twice a week. This is in addition to the moderate-intensity exercise. Spend less time sitting. Even light physical activity can be beneficial. Watch cholesterol and blood lipids Have your blood tested for lipids and cholesterol at 68 years of age, then have this test every 5 years. You may need to have your cholesterol levels checked more often if: Your lipid or cholesterol levels are high. You are older than 68 years of age. You are at high risk for heart disease. What should I know about cancer screening? Many types of cancers can be detected early and may often be prevented. Depending on your health history and family history, you may need to have cancer screening at various ages. This may include screening for: Colorectal cancer. Prostate cancer. Skin cancer. Lung  cancer. What should I know about heart disease, diabetes, and high blood pressure? Blood pressure and heart disease High blood pressure causes heart disease and increases the risk of stroke. This is more likely to develop in people who have high blood pressure readings or are overweight. Talk with your health care provider about your target blood pressure readings. Have your blood pressure checked: Every 3-5 years if you are 18-39 years of age. Every year if you are 40 years old or older. If you are between the ages of 65 and 75 and are a current or former smoker, ask your health care provider if you should have a one-time screening for abdominal aortic aneurysm (AAA). Diabetes Have regular diabetes screenings. This checks your fasting blood sugar level. Have the screening done: Once every three years after age 45 if you are at a normal weight and have a low risk for diabetes. More often and at a younger age if you are overweight or have a high risk for diabetes. What should I know about preventing infection? Hepatitis B If you have a higher risk for hepatitis B, you should be screened for this virus. Talk with your health care provider to find out if you are at risk for hepatitis B infection. Hepatitis C Blood testing is recommended for: Everyone born from 1945 through 1965. Anyone with known risk factors for hepatitis C. Sexually transmitted infections (STIs) You should be screened each year for STIs, including gonorrhea and chlamydia, if: You are sexually active and are younger than 68 years of age. You are older than 68 years of age and your   health care provider tells you that you are at risk for this type of infection. Your sexual activity has changed since you were last screened, and you are at increased risk for chlamydia or gonorrhea. Ask your health care provider if you are at risk. Ask your health care provider about whether you are at high risk for HIV. Your health care provider  may recommend a prescription medicine to help prevent HIV infection. If you choose to take medicine to prevent HIV, you should first get tested for HIV. You should then be tested every 3 months for as long as you are taking the medicine. Follow these instructions at home: Alcohol use Do not drink alcohol if your health care provider tells you not to drink. If you drink alcohol: Limit how much you have to 0-2 drinks a day. Know how much alcohol is in your drink. In the [place] one drink equals one 12 oz bottle of beer (355 mL), one 5 oz glass of wine (148 mL), or one 1 oz glass of hard liquor (44 mL). Lifestyle Do not use any products that contain nicotine or tobacco. These products include cigarettes, chewing tobacco, and vaping devices, such as e-cigarettes. If you need help quitting, ask your health care provider. Do not use street drugs. Do not share needles. Ask your health care provider for help if you need support or information about quitting drugs. General instructions Schedule regular health, dental, and eye exams. Stay current with your vaccines. Tell your health care provider if: You often feel depressed. You have ever been abused or do not feel safe at home. Summary Adopting a healthy lifestyle and getting preventive care are important in promoting health and wellness. Follow your health care provider's instructions about healthy diet, exercising, and getting tested or screened for diseases. Follow your health care provider's instructions on monitoring your cholesterol and blood pressure. This information is not intended to replace advice given to you by your health care provider. Make sure you discuss any questions you have with your health care provider. Document Revised: 01/20/2021 Document Reviewed: 01/20/2021 Elsevier Patient Education  2023 [employer]vier Inc.  

## 2022-12-15 ENCOUNTER — Ambulatory Visit (INDEPENDENT_AMBULATORY_CARE_PROVIDER_SITE_OTHER): Payer: 59

## 2022-12-15 DIAGNOSIS — Z0000 Encounter for general adult medical examination without abnormal findings: Secondary | ICD-10-CM

## 2022-12-24 ENCOUNTER — Ambulatory Visit (INDEPENDENT_AMBULATORY_CARE_PROVIDER_SITE_OTHER): Payer: 59 | Admitting: Student

## 2022-12-24 ENCOUNTER — Encounter: Payer: Self-pay | Admitting: Student

## 2022-12-24 VITALS — BP 127/81 | HR 72 | Ht 74.0 in | Wt 182.8 lb

## 2022-12-24 DIAGNOSIS — I10 Essential (primary) hypertension: Secondary | ICD-10-CM | POA: Diagnosis not present

## 2022-12-24 DIAGNOSIS — E119 Type 2 diabetes mellitus without complications: Secondary | ICD-10-CM | POA: Diagnosis not present

## 2022-12-24 DIAGNOSIS — K5900 Constipation, unspecified: Secondary | ICD-10-CM

## 2022-12-24 DIAGNOSIS — N529 Male erectile dysfunction, unspecified: Secondary | ICD-10-CM | POA: Diagnosis not present

## 2022-12-24 LAB — POCT GLYCOSYLATED HEMOGLOBIN (HGB A1C): HbA1c, POC (prediabetic range): 6.3 % (ref 5.7–6.4)

## 2022-12-24 MED ORDER — LACTULOSE 10 GM/15ML PO SOLN: ORAL | 1 refills | Status: AC

## 2022-12-24 MED ORDER — LOSARTAN POTASSIUM 100 MG PO TABS: 100.0000 mg | ORAL_TABLET | Freq: Every day | ORAL | 3 refills | Status: DC

## 2022-12-24 MED ORDER — SILDENAFIL CITRATE 50 MG PO TABS: 50.0000 mg | ORAL_TABLET | Freq: Every day | ORAL | 0 refills | Status: DC | PRN

## 2022-12-24 MED ORDER — ATORVASTATIN CALCIUM 20 MG PO TABS: 20.0000 mg | ORAL_TABLET | Freq: Every day | ORAL | 2 refills | Status: DC

## 2022-12-24 MED ORDER — METFORMIN HCL 500 MG PO TABS: 500.0000 mg | ORAL_TABLET | Freq: Two times a day (BID) | ORAL | 2 refills | Status: DC

## 2022-12-24 NOTE — Assessment & Plan Note (Signed)
Refilled medication

## 2022-12-24 NOTE — Assessment & Plan Note (Signed)
Blood pressure well-controlled on current medication.  Refilled losartan 100 mg daily.

## 2022-12-24 NOTE — Assessment & Plan Note (Addendum)
A1c 6.3.  Appears stable on metformin At the next visit consider reducing metformin to 500 mg daily Due for diabetic foot exam Ordered urine microalbumin Sent referral for eye exam

## 2022-12-24 NOTE — Progress Notes (Signed)
    SUBJECTIVE:   CHIEF COMPLAINT / HPI:   [name] is a 67 y.o. male  presenting for follow-up of chronic conditions:  Type 2 diabetes: Currently stable on metformin 500 mg 2 times daily.  He reports no adverse side effects or reaction.  His last A1c was 6.1.  Hypertension: He has been stable on his losartan 100 mg daily with no reported side effects.  Constipation: He has been stable on lactulose and reports good compliance and effect.    ED: Stable on Viagra with no reported side effects.  PERTINENT  PMH / PSH: reviewed   OBJECTIVE:   BP 127/81   Pulse 72   Ht 6\' 2"  (1.88 m)   Wt 182 lb 12.8 oz (82.9 kg)   SpO2 100%   BMI 23.47 kg/m   Well-appearing, no acute distress, hard of hearing with hearing aids in place Cardio: Regular rate, regular rhythm, no murmurs on exam. Pulm: Clear, no wheezing, no crackles. No increased work of breathing Abdominal: bowel sounds present, soft, non-tender, non-distended Extremities: no peripheral edema    ASSESSMENT/PLAN:   Type 2 diabetes mellitus without complication (HCC) A1c 6.3.  Appears stable on metformin At the next visit consider reducing metformin to 500 mg daily Due for diabetic foot exam Ordered urine microalbumin Sent referral for eye exam  Hypertension Blood pressure well-controlled on current medication.  Refilled losartan 100 mg daily.  Constipation Having regular bowel movements with lactulose.  Refilled medication  Erectile dysfunction Refilled medication   Healthcare maintenance: Due for shingles and Tdap.  Discussed these vaccines with the patient today, he will consider getting them at the pharmacy.  Glendale Chard, DO [facility]

## 2022-12-24 NOTE — Patient Instructions (Signed)
It was great to see you today!   Today we addressed: You are due for a shingles shot and tetanus shot  Medicines refilled  You should return to our clinic Return in about 3 months (around 03/25/2023).  Please arrive 15 minutes before your appointment to ensure smooth check in process.    Please call the clinic at [phone] if your symptoms worsen or you have any concerns.  Thank you for allowing me to participate in your care, Dr. Glendale Chard [facility]

## 2022-12-24 NOTE — Assessment & Plan Note (Signed)
Having regular bowel movements with lactulose.  Refilled medication

## 2022-12-25 ENCOUNTER — Encounter: Payer: Self-pay | Admitting: Student

## 2022-12-25 LAB — MICROALBUMIN / CREATININE URINE RATIO
Creatinine, Urine: 179.2 mg/dL
Microalb/Creat Ratio: 10 mg/g creat (ref 0–29)
Microalbumin, Urine: 18.4 ug/mL

## 2022-12-25 NOTE — Progress Notes (Unsigned)
Routed result letter to staff.   Glendale Chard, DO [facility], PGY-1 12/25/22 4:12 PM

## 2022-12-29 ENCOUNTER — Ambulatory Visit (HOSPITAL_COMMUNITY)
Admission: EM | Admit: 2022-12-29 | Discharge: 2022-12-29 | Disposition: A | Discharge status: Home / Self Care | Payer: 59 | Attending: Physician Assistant | Admitting: Physician Assistant

## 2022-12-29 ENCOUNTER — Encounter (HOSPITAL_COMMUNITY): Payer: Self-pay

## 2022-12-29 DIAGNOSIS — W540XXA Bitten by dog, initial encounter: Secondary | ICD-10-CM

## 2022-12-29 DIAGNOSIS — Z2914 Encounter for prophylactic rabies immune globin: Secondary | ICD-10-CM | POA: Diagnosis not present

## 2022-12-29 DIAGNOSIS — Z23 Encounter for immunization: Secondary | ICD-10-CM

## 2022-12-29 DIAGNOSIS — T148XXA Other injury of unspecified body region, initial encounter: Secondary | ICD-10-CM

## 2022-12-29 MED ORDER — RABIES IMMUNE GLOBULIN 150 UNIT/ML IM INJ
20.0000 [IU]/kg | INJECTION | Freq: Once | INTRAMUSCULAR | Status: AC
  Administered 2022-12-29: 1650 [IU]

## 2022-12-29 MED ORDER — RABIES VACCINE, PCEC IM SUSR
1.0000 mL | Freq: Once | INTRAMUSCULAR | Status: AC
  Administered 2022-12-29: 1 mL via INTRAMUSCULAR

## 2022-12-29 MED ORDER — RABIES VACCINE, PCEC IM SUSR
INTRAMUSCULAR | Status: AC
  Filled 2022-12-29: qty 1

## 2022-12-29 MED ORDER — AMOXICILLIN-POT CLAVULANATE 875-125 MG PO TABS: 1.0000 | ORAL_TABLET | Freq: Two times a day (BID) | ORAL | 0 refills | Status: DC

## 2022-12-29 MED ORDER — RABIES IMMUNE GLOBULIN 150 UNIT/ML IM INJ
INJECTION | INTRAMUSCULAR | Status: AC
  Filled 2022-12-29: qty 2

## 2022-12-29 MED ORDER — RABIES IMMUNE GLOBULIN 150 UNIT/ML IM INJ
INJECTION | INTRAMUSCULAR | Status: AC
  Filled 2022-12-29: qty 10

## 2022-12-29 MED ORDER — TETANUS-DIPHTH-ACELL PERTUSSIS 5-2.5-18.5 LF-MCG/0.5 IM SUSY
PREFILLED_SYRINGE | INTRAMUSCULAR | Status: AC
  Filled 2022-12-29: qty 0.5

## 2022-12-29 MED ORDER — TETANUS-DIPHTH-ACELL PERTUSSIS 5-2.5-18.5 LF-MCG/0.5 IM SUSY
0.5000 mL | PREFILLED_SYRINGE | Freq: Once | INTRAMUSCULAR | Status: AC
  Administered 2022-12-29: 0.5 mL via INTRAMUSCULAR

## 2022-12-29 NOTE — Discharge Instructions (Signed)
Meds ordered this encounter  Medications   rabies immune globulin (HYPERRAB/KEDRAB) injection 1,650 Units   rabies vaccine (RABAVERT) injection 1 mL   Tdap (BOOSTRIX) injection 0.5 mL   amoxicillin-clavulanate (AUGMENTIN) 875-125 MG tablet    Sig: Take 1 tablet by mouth every 12 (twelve) hours.    Dispense:  14 tablet    Refill:  0    Order Specific Question:   Supervising Provider    Answer:   Merrilee Jansky [id number]    In addition to the human rabies immune globulin (HRIG), you were given the first dose of the rabies vaccine today (Day 0).  Please return here on the following dates to complete the rabies series: Day 3: 01/01/2023 Day 7: 01/05/2023 Day 14: 01/12/2023   Take Augmentin twice daily for 7 days.  Return as described above for your subsequent rabies vaccines.  If you have any worsening symptoms including swelling of the area, redness, drainage, pain you need to be seen immediately.

## 2022-12-29 NOTE — ED Provider Notes (Signed)
[facility]    CSN: [id number] Arrival date & time: 12/29/22  1720      History   Chief Complaint Chief Complaint  Patient presents with   Animal Bite    HPI [name] is a 67 y.o. male.   Patient presents today following dog bite that occurred earlier today.  Reports that he was walking when someone's dog came up and bit the back of his left leg.  The person reports that they have had some of their vaccines but were to for others and did not have proof of rabies vaccination.  Patient is anxious and would like to begin rabies immunoglobulin and vaccination series.  He has never had this vaccination or immunoglobulin in the past.  He is unsure when his last tetanus was.  He did clean the wound with hydrogen peroxide and dressed it prior to coming here.  He denies any recent antibiotics.  He is able to ambulate unassisted.  Denies any significant pain or decreased movement.    Past Medical History:  Diagnosis Date   Hypercalcemia 01/24/2020   Hyperlipidemia    Weight loss, unintentional 01/20/2021    Patient Active Problem List   Diagnosis Date Noted   History of tobacco use 04/29/2022   Chronic left shoulder pain 03/12/2022   Lower leg injury, right, initial encounter 03/03/2022   Erectile dysfunction 02/17/2022   Illiterate 08/21/2021   Hypertension 07/29/2020   Conductive hearing loss, bilateral 10/23/2019   Hyperlipidemia 06/07/2018   Type 2 diabetes mellitus without complication 06/07/2018   Constipation 06/07/2018    History reviewed. No pertinent surgical history.     Home Medications    Prior to Admission medications   Medication Sig Start Date End Date Taking? Authorizing Provider  amoxicillin-clavulanate (AUGMENTIN) 875-125 MG tablet Take 1 tablet by mouth every 12 (twelve) hours. 12/29/22  Yes Tahni Porchia K, PA-C  atorvastatin (LIPITOR) 20 MG tablet Take 1 tablet (20 mg total) by mouth daily. 12/24/22 09/20/23 Yes Glendale Chard, DO  Blood  Pressure KIT Check blood pressure 2-3 time a week 07/15/21  Yes Dana Allan, MD  lactulose (CONSTULOSE) 10 GM/15ML solution TAKE 15 MLS BY MOUTH DAILY AS NEEDED FOR MILD CONSTIPATION. 12/24/22  Yes Glendale Chard, DO  losartan (COZAAR) 100 MG tablet Take 1 tablet (100 mg total) by mouth at bedtime. 12/24/22  Yes Glendale Chard, DO  metFORMIN (GLUCOPHAGE) 500 MG tablet Take 1 tablet (500 mg total) by mouth 2 (two) times daily with a meal. 12/24/22  Yes Glendale Chard, DO  sildenafil (VIAGRA) 50 MG tablet Take 1 tablet (50 mg total) by mouth daily as needed for erectile dysfunction. 12/24/22  Yes Glendale Chard, DO    Family History Family History  Problem Relation Age of Onset   Diabetes Mother    Diabetes Brother    Colon polyps Neg Hx    Esophageal cancer Neg Hx    Rectal cancer Neg Hx    Stomach cancer Neg Hx     Social History Social History   Tobacco Use   Smoking status: Every Day    Packs/day: 0.50    Years: 46.00    Additional pack years: 0.00    Total pack years: 23.00    Types: Cigarettes    Start date: 09/15/1975    Last attempt to quit: 12/08/2021    Years since quitting: 1.0    Passive exposure: Past   Smokeless tobacco: Never   Tobacco comments:    Quit March 2023 -  using Varenicline.  46 pack year hx of tobacco use - Off all tx and remains quit 04/29/2022  Vaping Use   Vaping Use: Never used  Substance Use Topics   Alcohol use: Never   Drug use: Never     Allergies   Patient has no known allergies.   Review of Systems Review of Systems  Constitutional:  Negative for activity change, appetite change, fatigue and fever.  Musculoskeletal:  Negative for arthralgias and myalgias.  Skin:  Positive for wound. Negative for color change.     Physical Exam Triage Vital Signs ED Triage Vitals  Enc Vitals Group     BP 12/29/22 1815 127/83     Pulse Rate 12/29/22 1815 63     Resp 12/29/22 1815 18     Temp 12/29/22 1815 97.9 F (36.6 C)     Temp Source 12/29/22  1815 Oral     SpO2 12/29/22 1815 97 %     Weight 12/29/22 1814 182 lb (82.6 kg)     Height --      Head Circumference --      Peak Flow --      Pain Score 12/29/22 1814 0     Pain Loc --      Pain Edu? --      Excl. in GC? --    No data found.  Updated Vital Signs BP 127/83 (BP Location: Right Arm)   Pulse 63   Temp 97.9 F (36.6 C) (Oral)   Resp 18   Wt 182 lb (82.6 kg)   SpO2 97%   BMI 23.37 kg/m   Visual Acuity Right Eye Distance:   Left Eye Distance:   Bilateral Distance:    Right Eye Near:   Left Eye Near:    Bilateral Near:     Physical Exam Vitals reviewed.  Constitutional:      General: He is awake.     Appearance: Normal appearance. He is well-developed. He is not ill-appearing.     Comments: Very pleasant male appears stated age in no acute distress sitting comfortably in exam room  HENT:     Head: Normocephalic and atraumatic.     Mouth/Throat:     Pharynx: Uvula midline. No oropharyngeal exudate or posterior oropharyngeal erythema.  Cardiovascular:     Rate and Rhythm: Normal rate and regular rhythm.     Heart sounds: Normal heart sounds, S1 normal and S2 normal. No murmur heard. Pulmonary:     Effort: Pulmonary effort is normal.     Breath sounds: Normal breath sounds. No stridor. No wheezing, rhonchi or rales.     Comments: Clear to auscultation bilaterally Musculoskeletal:     Left upper leg: Laceration present. No swelling, deformity, tenderness or bony tenderness.       Legs:     Comments: L-shaped abrasion on posterior left upper leg without active bleeding or drainage.  Neurological:     Mental Status: He is alert.  Psychiatric:        Behavior: Behavior is cooperative.      UC Treatments / Results  Labs (all labs ordered are listed, but only abnormal results are displayed) Labs Reviewed - No data to display  EKG   Radiology No results found.  Procedures Procedures (including critical care time)  Medications Ordered in  UC Medications  rabies immune globulin (HYPERRAB/KEDRAB) injection 1,650 Units (1,650 Units Infiltration Given 12/29/22 1937)  rabies vaccine (RABAVERT) injection 1 mL (1 mL Intramuscular Given 12/29/22 1932)  Tdap (BOOSTRIX) injection 0.5 mL (0.5 mLs Intramuscular Given 12/29/22 1930)    Initial Impression / Assessment and Plan / UC Course  I have reviewed the triage vital signs and the nursing notes.  Pertinent labs & imaging results that were available during my care of the patient were reviewed by me and considered in my medical decision making (see chart for details).     Patient is well-appearing, afebrile, nontoxic, nontachycardic.  No indication for primary closure of wound.  We discussed low likelihood of transmission from domesticated dog, however, patient was concerned about potential way to use exposure and preferred to undergo immunoglobulin infiltration and injections prophylactically.  Wound was appropriately infiltrated with approximately 4 mL of immunoglobulin.  The remaining doses as well as initial rabies vaccine and Tdap were given by nursing staff.  We discussed that he would need to return for additional rabies vaccines on days 3, 7, 14 and he was given a schedule of when to return.  Given dog bite he was started on Augmentin twice daily.  We discussed that he is to keep this area clean with soap and water.  If he has any signs of infection including erythema, drainage, fever, increasing pain he needs to be seen immediately.  Strict return precautions given.  All questions answered to patient satisfaction.  Final Clinical Impressions(s) / UC Diagnoses   Final diagnoses:  Abrasion of skin  Dog bite, initial encounter  Encounter for prophylactic administration of rabies immune globulin     Discharge Instructions       Meds ordered this encounter  Medications   rabies immune globulin (HYPERRAB/KEDRAB) injection 1,650 Units   rabies vaccine (RABAVERT) injection 1 mL    Tdap (BOOSTRIX) injection 0.5 mL   amoxicillin-clavulanate (AUGMENTIN) 875-125 MG tablet    Sig: Take 1 tablet by mouth every 12 (twelve) hours.    Dispense:  14 tablet    Refill:  0    Order Specific Question:   Supervising Provider    Answer:   Merrilee Jansky [id number]    In addition to the human rabies immune globulin (HRIG), you were given the first dose of the rabies vaccine today (Day 0).  Please return here on the following dates to complete the rabies series: Day 3: 01/01/2023 Day 7: 01/05/2023 Day 14: 01/12/2023   Take Augmentin twice daily for 7 days.  Return as described above for your subsequent rabies vaccines.  If you have any worsening symptoms including swelling of the area, redness, drainage, pain you need to be seen immediately.     ED Prescriptions     Medication Sig Dispense Auth. Provider   amoxicillin-clavulanate (AUGMENTIN) 875-125 MG tablet Take 1 tablet by mouth every 12 (twelve) hours. 14 tablet Inella Kuwahara, Noberto Retort, PA-C      PDMP not reviewed this encounter.   Jeani Hawking, PA-C 12/29/22 2000

## 2022-12-29 NOTE — ED Triage Notes (Signed)
Pt states that a dog bit him today at 3. Left leg. Dog hasn't had shot. Pt does not know dog. Owners stated that he's not been vaccinated.

## 2023-01-01 ENCOUNTER — Encounter (HOSPITAL_COMMUNITY): Payer: Self-pay

## 2023-01-01 ENCOUNTER — Ambulatory Visit (HOSPITAL_COMMUNITY)
Admission: EM | Admit: 2023-01-01 | Discharge: 2023-01-01 | Disposition: A | Discharge status: Home / Self Care | Payer: 59 | Attending: Internal Medicine | Admitting: Internal Medicine

## 2023-01-01 VITALS — BP 113/70 | HR 61 | Temp 98.1°F | Resp 18 | Wt 182.1 lb

## 2023-01-01 DIAGNOSIS — Z203 Contact with and (suspected) exposure to rabies: Secondary | ICD-10-CM

## 2023-01-01 MED ORDER — RABIES VACCINE, PCEC IM SUSR
1.0000 mL | Freq: Once | INTRAMUSCULAR | Status: AC
  Administered 2023-01-01: 1 mL via INTRAMUSCULAR

## 2023-01-01 MED ORDER — RABIES VACCINE, PCEC IM SUSR
INTRAMUSCULAR | Status: AC
  Filled 2023-01-01: qty 1

## 2023-01-01 NOTE — ED Notes (Signed)
Gave pt vaccine in right deltoid. Pt tolerated well.

## 2023-01-01 NOTE — ED Triage Notes (Signed)
Patient is here for his second vaccine for rabies.

## 2023-01-05 ENCOUNTER — Ambulatory Visit (HOSPITAL_COMMUNITY): Payer: 59

## 2023-01-06 ENCOUNTER — Ambulatory Visit (HOSPITAL_COMMUNITY)
Admission: RE | Admit: 2023-01-06 | Discharge: 2023-01-06 | Disposition: A | Discharge status: Home / Self Care | Payer: 59 | Source: Ambulatory Visit | Attending: Emergency Medicine | Admitting: Emergency Medicine

## 2023-01-06 ENCOUNTER — Other Ambulatory Visit: Payer: Self-pay

## 2023-01-06 ENCOUNTER — Encounter (HOSPITAL_COMMUNITY): Payer: Self-pay

## 2023-01-06 ENCOUNTER — Ambulatory Visit (HOSPITAL_COMMUNITY): Payer: 59

## 2023-01-06 DIAGNOSIS — Z23 Encounter for immunization: Secondary | ICD-10-CM

## 2023-01-06 DIAGNOSIS — Z203 Contact with and (suspected) exposure to rabies: Secondary | ICD-10-CM | POA: Diagnosis not present

## 2023-01-06 MED ORDER — RABIES VACCINE, PCEC IM SUSR
INTRAMUSCULAR | Status: AC
  Filled 2023-01-06: qty 1

## 2023-01-06 MED ORDER — RABIES VACCINE, PCEC IM SUSR
1.0000 mL | Freq: Once | INTRAMUSCULAR | Status: AC
  Administered 2023-01-06: 1 mL via INTRAMUSCULAR

## 2023-01-06 NOTE — ED Notes (Signed)
Reviewed when appt is for next injection

## 2023-01-06 NOTE — Discharge Instructions (Signed)
Return for final rabies injection as scheduled.  Return sooner with any concerns

## 2023-01-06 NOTE — ED Triage Notes (Signed)
Patient is in department for 3rd rabies injection.  Denies complaints

## 2023-01-12 ENCOUNTER — Ambulatory Visit (HOSPITAL_COMMUNITY)
Admission: EM | Admit: 2023-01-12 | Discharge: 2023-01-12 | Disposition: A | Discharge status: Home / Self Care | Payer: 59 | Attending: Internal Medicine | Admitting: Internal Medicine

## 2023-01-12 ENCOUNTER — Encounter (HOSPITAL_COMMUNITY): Payer: Self-pay

## 2023-01-12 ENCOUNTER — Other Ambulatory Visit: Payer: Self-pay

## 2023-01-12 DIAGNOSIS — Z203 Contact with and (suspected) exposure to rabies: Secondary | ICD-10-CM | POA: Diagnosis not present

## 2023-01-12 MED ORDER — RABIES VACCINE, PCEC IM SUSR
1.0000 mL | Freq: Once | INTRAMUSCULAR | Status: AC
  Administered 2023-01-12: 1 mL via INTRAMUSCULAR

## 2023-01-12 MED ORDER — METHYLPREDNISOLONE SODIUM SUCC 125 MG IJ SOLR
INTRAMUSCULAR | Status: AC
  Filled 2023-01-12: qty 2

## 2023-01-12 MED ORDER — RABIES VACCINE, PCEC IM SUSR
INTRAMUSCULAR | Status: AC
  Filled 2023-01-12: qty 1

## 2023-01-12 MED ORDER — ONDANSETRON 4 MG PO TBDP
ORAL_TABLET | ORAL | Status: AC
  Filled 2023-01-12: qty 1

## 2023-01-12 MED ORDER — DIPHENHYDRAMINE HCL 25 MG PO CAPS
ORAL_CAPSULE | ORAL | Status: AC
  Filled 2023-01-12: qty 2

## 2023-01-12 MED ORDER — FAMOTIDINE 20 MG PO TABS
ORAL_TABLET | ORAL | Status: AC
  Filled 2023-01-12: qty 1

## 2023-01-12 NOTE — ED Triage Notes (Signed)
Pt resent for 4 Rabies Vaccine today.

## 2023-01-12 NOTE — ED Notes (Signed)
PT refused discharge paper work

## 2023-02-15 ENCOUNTER — Telehealth: Payer: Self-pay | Admitting: Pharmacist

## 2023-02-15 DIAGNOSIS — Z87891 Personal history of nicotine dependence: Secondary | ICD-10-CM

## 2023-02-15 NOTE — Assessment & Plan Note (Signed)
Patient contacted for follow/up of tobacco cessation 1 year prior.  Since last contact patient reports complete abstinence.  States he still thinks about smoking occasionally but simply avoids smoking.   Medications currently being used - None.  Continues to rate CONFIDENCE of remaining tobacco free as high. Congratulated on successful quitting for > 1 year!   Total time with patient call and documentation of interaction: 9 minutes. No additional follow-up phone call planned   

## 2023-02-15 NOTE — Telephone Encounter (Signed)
Reviewed and agree with Dr Koval's plan.   

## 2023-02-15 NOTE — Telephone Encounter (Signed)
Patient contacted for follow/up of tobacco cessation 1 year prior.  Since last contact patient reports complete abstinence.  States he still thinks about smoking occasionally but simply avoids smoking.   Medications currently being used - None.  Continues to rate CONFIDENCE of remaining tobacco free as high. Congratulated on successful quitting for > 1 year!   Total time with patient call and documentation of interaction: 9 minutes. No additional follow-up phone call planned

## 2023-03-09 ENCOUNTER — Other Ambulatory Visit: Payer: Self-pay | Admitting: Student

## 2023-03-09 DIAGNOSIS — N529 Male erectile dysfunction, unspecified: Secondary | ICD-10-CM

## 2023-04-15 ENCOUNTER — Other Ambulatory Visit: Payer: Self-pay | Admitting: Student

## 2023-04-15 DIAGNOSIS — N529 Male erectile dysfunction, unspecified: Secondary | ICD-10-CM

## 2023-04-20 ENCOUNTER — Telehealth: Payer: Self-pay | Admitting: Student

## 2023-04-20 DIAGNOSIS — E119 Type 2 diabetes mellitus without complications: Secondary | ICD-10-CM

## 2023-04-20 MED ORDER — METFORMIN HCL 500 MG PO TABS: 500.0000 mg | ORAL_TABLET | Freq: Two times a day (BID) | ORAL | 2 refills | Status: DC

## 2023-04-20 NOTE — Telephone Encounter (Signed)
Metformin refilled.   Glendale Chard, DO [facility], PGY-2 04/20/23 10:11 AM

## 2023-04-20 NOTE — Telephone Encounter (Signed)
Patient wants medication refilled so he can pick it up today.  Stating he is completely out.

## 2023-04-20 NOTE — Telephone Encounter (Signed)
Patient walked in to request refill of:  Name of Medication(s):  Metformin  Last date of OV:  12/24/22 Pharmacy:  same  Will route refill request to Clinic RN.  Discussed with patient policy to call pharmacy for future refills.  Also, discussed refills may take up to 48 hours to approve or deny.  Vilinda Blanks

## 2023-04-21 ENCOUNTER — Ambulatory Visit (INDEPENDENT_AMBULATORY_CARE_PROVIDER_SITE_OTHER): Payer: 59 | Admitting: Student

## 2023-04-21 VITALS — BP 130/76 | HR 51 | Ht 74.0 in | Wt 182.4 lb

## 2023-04-21 DIAGNOSIS — M48061 Spinal stenosis, lumbar region without neurogenic claudication: Secondary | ICD-10-CM | POA: Diagnosis not present

## 2023-04-21 DIAGNOSIS — M62830 Muscle spasm of back: Secondary | ICD-10-CM | POA: Diagnosis not present

## 2023-04-21 MED ORDER — BACLOFEN 10 MG PO TABS
10.0000 mg | ORAL_TABLET | Freq: Three times a day (TID) | ORAL | 0 refills | Status: DC | PRN
Start: 2023-04-21 — End: 2023-05-21

## 2023-04-21 NOTE — Progress Notes (Signed)
    SUBJECTIVE:   CHIEF COMPLAINT / HPI: Back pain  States that he intermittently has back pain from his previous fall.  States that currently he has a back spasm on the right lower portion of his back.  He states that he feels it the most when he is sitting down and leaning forward.  It gets better with movement and walking.  He has not been stretching much.  He denies any radiation of the pain down his legs.  He denies any saddle anesthesia.  Denies any weakness in his legs.  Denies any sensation differences in his legs.  He states that muscle relaxers have helped him in the past and he would prefer to have this to help.  PERTINENT  PMH / PSH: History of T12 fracture in 2022, lumbar spondylosis, multilevel disc disease and facet disease  OBJECTIVE:   BP 130/76   Pulse (!) 51   Ht 6\' 2"  (1.88 m)   Wt 182 lb 6.4 oz (82.7 kg)   SpO2 100%   BMI 23.42 kg/m    Gen: NAD, awake, alert Resp: normal WOB on RA Back: - Inspection: no gross deformity or asymmetry, swelling or ecchymosis - Palpation: no TTP of  spinous process, mild paraspinal musculature tenderness - ROM: some pain with flexion of back - Strength: 5/5 in BLE - Neuro: sensation intact in LE - Special testing: negative slump test/straight leg  ASSESSMENT/PLAN:   Spinal stenosis at L4-L5 level Last CT scan after fall in 2022 showed T12 fracture, mild degenerative lumbar spondylosis with multilevel disc disease and facet disease. This is most significant at L4-5 where there is moderately severe to severe spinal and bilateral lateral recess stenosis and mild right foraminal narrowing.  No red flag symptoms on examination.  He has had good effect with baclofen in the past and requests this today. - baclofen (LIORESAL) 10 MG tablet; Take 1 tablet (10 mg total) by mouth 3 (three) times daily as needed for up to 7 days for muscle spasms.  Dispense: 21 each; Refill: 0 - ice/heat to area - gentle stretching - return to care if not  improved     Levin Erp, MD [facility]

## 2023-04-21 NOTE — Assessment & Plan Note (Addendum)
Last CT scan after fall in 2022 showed T12 fracture, mild degenerative lumbar spondylosis with multilevel disc disease and facet disease. This is most significant at L4-5 where there is moderately severe to severe spinal and bilateral lateral recess stenosis and mild right foraminal narrowing.  No red flag symptoms on examination.  He has had good effect with baclofen in the past and requests this today. - baclofen (LIORESAL) 10 MG tablet; Take 1 tablet (10 mg total) by mouth 3 (three) times daily as needed for up to 7 days for muscle spasms.  Dispense: 21 each; Refill: 0 - ice/heat to area - gentle stretching - return to care if not improved

## 2023-04-21 NOTE — Patient Instructions (Addendum)
It was great to see you! Thank you for allowing me to participate in your care!   Our plans for today:  -I have sent in a short course of baclofen to take as needed for your back spasm -Please use heat on this area as well -Tylenol as needed as well -Return to care if not improving  Take care and seek immediate care sooner if you develop any concerns.  Levin Erp, MD

## 2023-05-15 ENCOUNTER — Other Ambulatory Visit: Payer: Self-pay | Admitting: Student

## 2023-05-15 DIAGNOSIS — N529 Male erectile dysfunction, unspecified: Secondary | ICD-10-CM

## 2023-05-21 ENCOUNTER — Other Ambulatory Visit: Payer: Self-pay | Admitting: Student

## 2023-05-21 DIAGNOSIS — M62830 Muscle spasm of back: Secondary | ICD-10-CM

## 2023-06-23 ENCOUNTER — Telehealth: Payer: Self-pay | Admitting: Student

## 2023-06-23 DIAGNOSIS — N529 Male erectile dysfunction, unspecified: Secondary | ICD-10-CM

## 2023-06-23 DIAGNOSIS — M62830 Muscle spasm of back: Secondary | ICD-10-CM

## 2023-06-23 NOTE — Telephone Encounter (Signed)
Patient came in stating that he needs a refill on his Viagra and his muscle relaxers please

## 2023-06-24 MED ORDER — SILDENAFIL CITRATE 50 MG PO TABS
50.0000 mg | ORAL_TABLET | Freq: Every day | ORAL | 0 refills | Status: DC | PRN
Start: 2023-06-24 — End: 2023-07-28

## 2023-06-24 MED ORDER — BACLOFEN 10 MG PO TABS: 10.0000 mg | ORAL_TABLET | Freq: Three times a day (TID) | ORAL | 0 refills | Status: DC | PRN

## 2023-06-29 ENCOUNTER — Encounter: Payer: Self-pay | Admitting: Student

## 2023-06-29 ENCOUNTER — Ambulatory Visit (INDEPENDENT_AMBULATORY_CARE_PROVIDER_SITE_OTHER): Payer: 59 | Admitting: Student

## 2023-06-29 VITALS — BP 135/82 | HR 55 | Ht 74.0 in | Wt 184.2 lb

## 2023-06-29 DIAGNOSIS — Z122 Encounter for screening for malignant neoplasm of respiratory organs: Secondary | ICD-10-CM | POA: Diagnosis not present

## 2023-06-29 DIAGNOSIS — E119 Type 2 diabetes mellitus without complications: Secondary | ICD-10-CM

## 2023-06-29 LAB — POCT GLYCOSYLATED HEMOGLOBIN (HGB A1C): HbA1c, POC (controlled diabetic range): 6.3 % (ref 0.0–7.0)

## 2023-06-29 NOTE — Progress Notes (Signed)
    SUBJECTIVE:   CHIEF COMPLAINT / HPI:   Patient is a 67 year old male with history of type 2 diabetes. He is presenting today for diabetes follow-up. A1c 6 months ago was 6.3 Currently on metformin 500 mg twice daily mild losartan and atorvastatin Denies any hypoglycemic signs and hyperglycemic episodes. Does not check home blood glucose.   PERTINENT  PMH / PSH: Reviewed   OBJECTIVE:   BP 135/82   Pulse (!) 55   Ht 6\' 2"  (1.88 m)   Wt 184 lb 3.2 oz (83.6 kg)   SpO2 100%   BMI 23.65 kg/m    Physical Exam General: Alert, well appearing, NAD Cardiovascular: RRR, No Murmurs, Normal S2/S2 Respiratory: CTAB, No wheezing or Rales Abdomen: No distension or tenderness Extremities: No edema on extremities     ASSESSMENT/PLAN:   Type 2 diabetes mellitus without complication (HCC) Controlled diabetes with A1c stable from 6 months ago at 6.3 today.  Endorses good compliance and tolerance to medication.  Patient working with insurance to schedule his ophthalmology appointment. -Continue education as prescribed -Obtained A1c -Ordered lab for microalbuminuria -Discussed need for dieting and exercising.    Healthcare maintenance - Low-dose chest CT ordered given patient's smoking history.  Discussed smoking cessation with patient. - COVID and Flu were offered, patient declined   Jerre Simon, MD [facility]

## 2023-06-29 NOTE — Assessment & Plan Note (Signed)
Controlled diabetes with A1c stable from 6 months ago at 6.3 today.  Endorses good compliance and tolerance to medication.  Patient working with insurance to schedule his ophthalmology appointment. -Continue education as prescribed -Obtained A1c -Ordered lab for microalbuminuria -Discussed need for dieting and exercising.

## 2023-06-29 NOTE — Patient Instructions (Signed)
It was wonderful to meet you today. Thank you for allowing me to be a part of your care. Below is a short summary of what we discussed at your visit today:  Your A1c today was 6.3.  Which is the same as 6 months ago.  Please continue to take your medications as prescribed.  Continue to monitor your diet and stay active.  Today we collected a urine sample to test for your kidney function.  Also have sent in order for your chest CT to screen for lung cancer given your smoking history.  You can go to [address] for the test.  If you have any questions or concerns, please do not hesitate to contact us via phone or MyChart message.   Jerre Simon, MD [facility]

## 2023-06-30 LAB — MICROALBUMIN / CREATININE URINE RATIO
Creatinine, Urine: 111.5 mg/dL
Microalb/Creat Ratio: 9 mg/g{creat} (ref 0–29)
Microalbumin, Urine: 10 ug/mL

## 2023-07-09 ENCOUNTER — Ambulatory Visit
Admission: RE | Admit: 2023-07-09 | Discharge: 2023-07-09 | Disposition: A | Discharge status: Home / Self Care | Payer: 59 | Source: Ambulatory Visit | Attending: Family Medicine | Admitting: Family Medicine

## 2023-07-09 DIAGNOSIS — Z122 Encounter for screening for malignant neoplasm of respiratory organs: Secondary | ICD-10-CM

## 2023-07-27 ENCOUNTER — Other Ambulatory Visit: Payer: Self-pay | Admitting: Student

## 2023-07-27 DIAGNOSIS — N529 Male erectile dysfunction, unspecified: Secondary | ICD-10-CM

## 2023-07-27 DIAGNOSIS — M62830 Muscle spasm of back: Secondary | ICD-10-CM

## 2023-08-30 ENCOUNTER — Telehealth: Payer: Self-pay | Admitting: Student

## 2023-08-30 NOTE — Telephone Encounter (Signed)
Patient came in stating that he still hasn't heard anything about the CT scan he had done in October. Asking for someone to please call him about the results

## 2023-08-31 NOTE — Telephone Encounter (Signed)
Called patient to discuss the result of her recent chest CT but unfortunately unable to reach patient. Left a generic HIPAA complaint VM with plan to attempt another call tomorrow 12/18.

## 2023-09-01 ENCOUNTER — Telehealth: Payer: Self-pay | Admitting: Student

## 2023-09-01 NOTE — Telephone Encounter (Signed)
Second attempt to reach patient to discuss CT results.  Unfortunately unable to reach patient again.  Left a generic voicemail and a letter has been sent with results.

## 2023-09-24 ENCOUNTER — Telehealth: Payer: Self-pay | Admitting: Student

## 2023-09-24 DIAGNOSIS — N529 Male erectile dysfunction, unspecified: Secondary | ICD-10-CM

## 2023-09-24 DIAGNOSIS — M62830 Muscle spasm of back: Secondary | ICD-10-CM

## 2023-09-24 MED ORDER — BACLOFEN 10 MG PO TABS
10.0000 mg | ORAL_TABLET | Freq: Three times a day (TID) | ORAL | 0 refills | Status: AC | PRN
Start: 2023-09-24 — End: ?

## 2023-09-24 MED ORDER — SILDENAFIL CITRATE 50 MG PO TABS
50.0000 mg | ORAL_TABLET | Freq: Every day | ORAL | 0 refills | Status: DC | PRN
Start: 2023-09-24 — End: 2023-10-04

## 2023-09-24 NOTE — Telephone Encounter (Signed)
 Refill sent to pharmacy.   Glendale Chard, DO [facility], PGY-2 09/24/23 11:27 AM

## 2023-09-24 NOTE — Telephone Encounter (Signed)
 Patient walked in to request refill of:  Name of Medication(s):  Viagra  and muscle relaxer Last date of OV:  06/29/23 Pharmacy:  same  Will route refill request to Clinic RN.  Discussed with patient policy to call pharmacy for future refills.  Also, discussed refills may take up to 48 hours to approve or deny.  [name]

## 2023-10-04 ENCOUNTER — Ambulatory Visit: Payer: 59 | Admitting: Student

## 2023-10-04 VITALS — BP 118/66 | HR 60 | Wt 184.6 lb

## 2023-10-04 DIAGNOSIS — E119 Type 2 diabetes mellitus without complications: Secondary | ICD-10-CM

## 2023-10-04 DIAGNOSIS — E785 Hyperlipidemia, unspecified: Secondary | ICD-10-CM

## 2023-10-04 DIAGNOSIS — N529 Male erectile dysfunction, unspecified: Secondary | ICD-10-CM | POA: Diagnosis not present

## 2023-10-04 DIAGNOSIS — Z7984 Long term (current) use of oral hypoglycemic drugs: Secondary | ICD-10-CM | POA: Diagnosis not present

## 2023-10-04 DIAGNOSIS — I10 Essential (primary) hypertension: Secondary | ICD-10-CM

## 2023-10-04 LAB — POCT GLYCOSYLATED HEMOGLOBIN (HGB A1C): HbA1c, POC (controlled diabetic range): 6 % (ref 0.0–7.0)

## 2023-10-04 MED ORDER — PNEUMOCOCCAL 13-VAL CONJ VACC IM SUSP: 0.5000 mL | INTRAMUSCULAR | 0 refills | Status: AC

## 2023-10-04 MED ORDER — ATORVASTATIN CALCIUM 20 MG PO TABS
20.0000 mg | ORAL_TABLET | Freq: Every day | ORAL | 2 refills | Status: DC
Start: 2023-10-04 — End: 2024-07-31

## 2023-10-04 MED ORDER — LOSARTAN POTASSIUM 100 MG PO TABS: 100.0000 mg | ORAL_TABLET | Freq: Every day | ORAL | 3 refills | Status: DC

## 2023-10-04 MED ORDER — SILDENAFIL CITRATE 50 MG PO TABS
50.0000 mg | ORAL_TABLET | Freq: Every day | ORAL | 0 refills | Status: DC | PRN
Start: 2023-10-04 — End: 2024-02-23

## 2023-10-04 MED ORDER — METFORMIN HCL 500 MG PO TABS: 500.0000 mg | ORAL_TABLET | Freq: Two times a day (BID) | ORAL | 2 refills | Status: DC

## 2023-10-04 NOTE — Assessment & Plan Note (Signed)
A1c well-controlled 6.0 today.  Will check BMP and lipid panel.  Continue metformin 500 mg 2 times a day

## 2023-10-04 NOTE — Assessment & Plan Note (Signed)
Lipid panel today continue atorvastatin 20 mg daily.  Refill sent to pharmacy

## 2023-10-04 NOTE — Patient Instructions (Signed)
It was great to see you today!   Today we addressed: Continue working on cutting back smoking  I am checking blood work today for your kidneys and cholesterol.  I have sent refills to your pharmacy  Go back to the eye doctor for a diabetic eye exam, you need to have one done every year since you have diabetes.   Future Appointments  Date Time Provider Department Center  12/17/2023 12:00 PM [address] [facility] None    Please arrive 15 minutes before your appointment to ensure smooth check in process.    Please call the clinic at [phone] if your symptoms worsen or you have any concerns.  Thank you for allowing me to participate in your care, Dr. Glendale Chard [facility]

## 2023-10-04 NOTE — Progress Notes (Signed)
    SUBJECTIVE:   CHIEF COMPLAINT / HPI:   [name] is a 68 y.o. male  presenting for annual follow-up.  He recently underwent a CT lung cancer screening which was normal but did show mild COPD changes.  He is currently smoking and has been successfully cutting back.  His blood pressure has been well-controlled on Cozaar 100 mg daily he denies side effects to the medication.  His diabetes is well-controlled with metformin 500 mg 2 times a day.  He denies side effects of medication  Hyperlipidemia: Compliant with 20 mg atorvastatin.  PERTINENT  PMH / PSH: Reviewed and updated   OBJECTIVE:   BP 118/66   Pulse 60   Wt 184 lb 9.6 oz (83.7 kg)   SpO2 98%   BMI 23.70 kg/m   Well-appearing, no acute distress Cardio: Regular rate, regular rhythm, no murmurs on exam. Pulm: Clear, no wheezing, no crackles. No increased work of breathing Abdominal: bowel sounds present, soft, non-tender, non-distended Extremities: no peripheral edema  Neuro: alert and oriented x3, speech normal in content, no facial asymmetry, strength intact and equal bilaterally in UE and LE, pupils equal and reactive to light.  Psych:  Cognition and judgment appear intact. Alert, communicative  and cooperative with normal attention span and concentration. No apparent delusions, illusions, hallucinations      10/04/2023   10:41 AM 12/15/2022    8:53 AM 03/02/2022   10:48 AM  PHQ9 SCORE ONLY  PHQ-9 Total Score 0 0 0      ASSESSMENT/PLAN:   Hypertension Well-controlled.  Continue losartan 100 mg daily.  Refill sent to pharmacy  Type 2 diabetes mellitus without complication (HCC) A1c well-controlled 6.0 today.  Will check BMP and lipid panel.  Continue metformin 500 mg 2 times a day  Hyperlipidemia Lipid panel today continue atorvastatin 20 mg daily.  Refill sent to pharmacy   Health Maintenance: Discussed pneumonia vaccine and printed prescription for patient to receive at pharmacy Patient declined  COVID-vaccine Discussed annual ophthalmology diabetic exam  Glendale Chard, DO [facility]

## 2023-10-04 NOTE — Assessment & Plan Note (Signed)
Well-controlled.  Continue losartan 100 mg daily.  Refill sent to pharmacy

## 2023-10-05 LAB — BASIC METABOLIC PANEL
BUN/Creatinine Ratio: 15 (ref 10–24)
BUN: 15 mg/dL (ref 8–27)
CO2: 22 mmol/L (ref 20–29)
Calcium: 10.1 mg/dL (ref 8.6–10.2)
Chloride: 107 mmol/L — ABNORMAL HIGH (ref 96–106)
Creatinine, Ser: 0.99 mg/dL (ref 0.76–1.27)
Glucose: 75 mg/dL (ref 70–99)
Potassium: 4.5 mmol/L (ref 3.5–5.2)
Sodium: 142 mmol/L (ref 134–144)
eGFR: 83 mL/min/{1.73_m2} (ref >59)

## 2023-10-05 LAB — MICROALBUMIN / CREATININE URINE RATIO
Creatinine, Urine: 136.9 mg/dL
Microalb/Creat Ratio: 2 mg/g{creat} (ref 0–29)
Microalbumin, Urine: 3.2 ug/mL

## 2023-10-05 LAB — LIPID PANEL
Chol/HDL Ratio: 2.5 {ratio} (ref 0.0–5.0)
Cholesterol, Total: 126 mg/dL (ref 100–199)
HDL: 51 mg/dL (ref >39)
LDL Chol Calc (NIH): 63 mg/dL (ref 0–99)
Triglycerides: 55 mg/dL (ref 0–149)
VLDL Cholesterol Cal: 12 mg/dL (ref 5–40)

## 2023-10-06 ENCOUNTER — Encounter: Payer: Self-pay | Admitting: Student

## 2023-10-06 NOTE — Progress Notes (Signed)
Lab work WNL. Repeat in one year.  Glendale Chard, DO [facility], PGY-2 10/06/23 9:15 AM

## 2023-10-09 LAB — HM DIABETES EYE EXAM

## 2023-10-11 ENCOUNTER — Telehealth: Payer: Self-pay | Admitting: Student

## 2023-10-11 NOTE — Telephone Encounter (Signed)
Patient walked in and submitted a copy of his eye exam (Dr. Aletta Edouard).  A copy has been placed in the doctor's box.

## 2023-12-17 ENCOUNTER — Ambulatory Visit (INDEPENDENT_AMBULATORY_CARE_PROVIDER_SITE_OTHER): Payer: 59

## 2024-02-02 ENCOUNTER — Telehealth: Payer: Self-pay

## 2024-02-02 NOTE — Telephone Encounter (Signed)
 Soni NP with [employer] calls nurse line reporting abnormal screening.   She reports PVD screening was done on both legs. She reports R leg mild result.   She reports he asymptomatic.  Patient is due for DM FU in July.   Called patient. He denies any swelling or shortness of breath.   Patient advised to call and schedule his apt for July with PCP, unfortunately the July schedule is not out yet.

## 2024-02-22 ENCOUNTER — Other Ambulatory Visit: Payer: Self-pay | Admitting: Student

## 2024-02-22 DIAGNOSIS — N529 Male erectile dysfunction, unspecified: Secondary | ICD-10-CM

## 2024-03-13 ENCOUNTER — Ambulatory Visit

## 2024-03-13 VITALS — Ht 74.0 in | Wt 168.0 lb

## 2024-03-13 DIAGNOSIS — Z0000 Encounter for general adult medical examination without abnormal findings: Secondary | ICD-10-CM

## 2024-03-13 NOTE — Progress Notes (Signed)
 Because this visit was a virtual/telehealth visit,  certain criteria was not obtained, such a blood pressure, CBG if applicable, and timed get up and go. Any medications not marked as taking were not mentioned during the medication reconciliation part of the visit. Any vitals not documented were not able to be obtained due to this being a telehealth visit or patient was unable to self-report a recent blood pressure reading due to a lack of equipment at home via telehealth. Vitals that have been documented are verbally provided by the patient.   Subjective:   [name] is a 68 y.o. who presents for a Medicare Wellness preventive visit.  As a reminder, Annual Wellness Visits don't include a physical exam, and some assessments may be limited, especially if this visit is performed virtually. We may recommend an in-person follow-up visit with your provider if needed.  Visit Complete: Virtual I connected with  Marvis Acron on 03/13/24 by a audio enabled telemedicine application and verified that I am speaking with the correct person using two identifiers.  Patient Location: Other:  car  Provider Location: Office/Clinic  I discussed the limitations of evaluation and management by telemedicine. The patient expressed understanding and agreed to proceed.  Vital Signs: Because this visit was a virtual/telehealth visit, some criteria may be missing or patient reported. Any vitals not documented were not able to be obtained and vitals that have been documented are patient reported.  VideoDeclined- This patient declined Librarian, academic. Therefore the visit was completed with audio only.  Persons Participating in Visit: Patient.  AWV Questionnaire: No: Patient Medicare AWV questionnaire was not completed prior to this visit.  Cardiac Risk Factors include: advanced age (>65men, >66 women);dyslipidemia;male gender;smoking/ tobacco exposure;hypertension      Objective:    Today's Vitals   03/13/24 1347  Weight: 168 lb (76.2 kg)  Height: 6' 2 (1.88 m)  PainSc: 0-No pain   Body mass index is 21.57 kg/m.     03/13/2024    1:48 PM 04/21/2023    9:57 AM 12/24/2022    9:01 AM 12/15/2022    9:03 AM 06/18/2022    8:26 AM 06/10/2022   10:07 AM 06/08/2022    8:37 AM  Advanced Directives  Does Patient Have a Medical Advance Directive? No No No No No No No  Would patient like information on creating a medical advance directive? No - Patient declined  No - Patient declined No - Patient declined No - Patient declined  No - Patient declined    Current Medications (verified) Outpatient Encounter Medications as of 03/13/2024  Medication Sig   atorvastatin  (LIPITOR) 20 MG tablet Take 1 tablet (20 mg total) by mouth daily.   baclofen  (LIORESAL ) 10 MG tablet Take 1 tablet (10 mg total) by mouth 3 (three) times daily as needed. for muscle spams   Blood Pressure KIT Check blood pressure 2-3 time a week   lactulose  (CONSTULOSE ) 10 GM/15ML solution TAKE 15 MLS BY MOUTH DAILY AS NEEDED FOR MILD CONSTIPATION.   losartan  (COZAAR ) 100 MG tablet Take 1 tablet (100 mg total) by mouth at bedtime.   metFORMIN  (GLUCOPHAGE ) 500 MG tablet Take 1 tablet (500 mg total) by mouth 2 (two) times daily with a meal.   sildenafil  (VIAGRA ) 50 MG tablet TAKE 1 TABLET BY MOUTH ONCE DAILY AS NEEDED FOR ERECTILE DYSFUNCTION   No facility-administered encounter medications on file as of 03/13/2024.    Allergies (verified) Patient has no known allergies.   History:  Past Medical History:  Diagnosis Date   Hypercalcemia 01/24/2020   Hyperlipidemia    Weight loss, unintentional 01/20/2021   History reviewed. No pertinent surgical history. Family History  Problem Relation Age of Onset   Diabetes Mother    Diabetes Brother    Colon polyps Neg Hx    Esophageal cancer Neg Hx    Rectal cancer Neg Hx    Stomach cancer Neg Hx    Social History   Socioeconomic History   Marital  status: Divorced    Spouse name: Not on file   Number of children: 3   Years of education: Not on file   Highest education level: Not on file  Occupational History   Occupation: retired  Tobacco Use   Smoking status: Every Day    Current packs/day: 0.00    Average packs/day: 0.5 packs/day for 46.2 years (23.1 ttl pk-yrs)    Types: Cigarettes    Start date: 09/15/1975    Last attempt to quit: 12/08/2021    Years since quitting: 2.2    Passive exposure: Past   Smokeless tobacco: Never   Tobacco comments:    Quit March 2023 - using Varenicline .  46 pack year hx of tobacco use - Off all tx and remains quit 04/29/2022  Vaping Use   Vaping status: Never Used  Substance and Sexual Activity   Alcohol use: Never   Drug use: Never   Sexual activity: Not Currently  Other Topics Concern   Not on file  Social History Narrative   Not on file   Social Drivers of Health   Financial Resource Strain: Low Risk  (03/13/2024)   Overall Financial Resource Strain (CARDIA)    Difficulty of Paying Living Expenses: Not hard at all  Food Insecurity: No Food Insecurity (03/13/2024)   Hunger Vital Sign    Worried About Running Out of Food in the Last Year: Never true    Ran Out of Food in the Last Year: Never true  Transportation Needs: No Transportation Needs (03/13/2024)   PRAPARE - Administrator, Civil Service (Medical): No    Lack of Transportation (Non-Medical): No  Physical Activity: Sufficiently Active (03/13/2024)   Exercise Vital Sign    Days of Exercise per Week: 7 days    Minutes of Exercise per Session: 60 min  Stress: No Stress Concern Present (03/13/2024)   [employer] of Occupational Health - Occupational Stress Questionnaire    Feeling of Stress: Not at all  Social Connections: Moderately Integrated (03/13/2024)   Social Connection and Isolation Panel    Frequency of Communication with Friends and Family: More than three times a week    Frequency of Social Gatherings  with Friends and Family: Twice a week    Attends Religious Services: More than 4 times per year    Active Member of [employer] or Organizations: Yes    Attends Banker Meetings: Never    Marital Status: Divorced    Tobacco Counseling Ready to quit: Not Answered Counseling given: Not Answered Tobacco comments: Quit March 2023 - using Varenicline .  46 pack year hx of tobacco use - Off all tx and remains quit 04/29/2022    Clinical Intake:  Pre-visit preparation completed: Yes  Pain : No/denies pain Pain Score: 0-No pain     BMI - recorded: 21.57 Nutritional Status: BMI of 19-24  Normal Nutritional Risks: None Diabetes: No  Lab Results  Component Value Date   HGBA1C 6.0 10/04/2023   HGBA1C  6.3 06/29/2023   HGBA1C 6.3 12/24/2022     How often do you need to have someone help you when you read instructions, pamphlets, or other written materials from your doctor or pharmacy?: 1 - Never  Interpreter Needed?: No  Information entered by :: Gibran Veselka N. Zelia Yzaguirre, LPN.   Activities of Daily Living     03/13/2024    1:49 PM  In your present state of health, do you have any difficulty performing the following activities:  Hearing? 0  Vision? 0  Difficulty concentrating or making decisions? 0  Walking or climbing stairs? 0  Dressing or bathing? 0  Doing errands, shopping? 0  Preparing Food and eating ? N  Using the Toilet? N  In the past six months, have you accidently leaked urine? N  Do you have problems with loss of bowel control? N  Managing your Medications? N  Managing your Finances? N  Housekeeping or managing your Housekeeping? N    Patient Care Team: Cleotilde Perkins, DO as PCP - General (Family Medicine) [place], [facility] [place], [place] as Referring Physician (Optometry)  I have updated your Care Teams any recent Medical Services you may have received from other providers in the past year.     Assessment:   This is a routine wellness examination for  [name].  Hearing/Vision screen Hearing Screening - Comments:: Patient wears haring aids.  Vision Screening - Comments:: Wears reading glasses - up to date with routine eye exams with [facility]    Goals Addressed             This Visit's Progress    03/13/2024: To stay healthy and smart.         Depression Screen     03/13/2024    1:49 PM 10/04/2023   10:41 AM 06/29/2023    8:58 AM 04/21/2023    9:56 AM 12/24/2022    9:23 AM 12/15/2022    8:53 AM 06/18/2022    8:27 AM  PHQ 2/9 Scores  PHQ - 2 Score 0 0    0   PHQ- 9 Score 0        Exception Documentation   Patient refusal Patient refusal Patient refusal  Patient refusal    Fall Risk     03/13/2024    1:49 PM 04/21/2023    9:57 AM 12/24/2022    9:01 AM 12/15/2022    8:53 AM 10/02/2022    2:05 PM  Fall Risk   Falls in the past year? 0 0 0 0 0  Number falls in past yr: 0 0 0 0   Injury with Fall? 0 0 0 0   Risk for fall due to : No Fall Risks   No Fall Risks   Follow up Falls evaluation completed   Falls evaluation completed     MEDICARE RISK AT HOME:  Medicare Risk at Home Any stairs in or around the home?: No If so, are there any without handrails?: No Home free of loose throw rugs in walkways, pet beds, electrical cords, etc?: Yes Adequate lighting in your home to reduce risk of falls?: Yes Life alert?: No Use of a cane, walker or w/c?: No Grab bars in the bathroom?: No Shower chair or bench in shower?: No Elevated toilet seat or a handicapped toilet?: No  TIMED UP AND GO:  Was the test performed?    Cognitive Function: 6CIT completed    03/13/2024    1:50 PM  MMSE - Mini Mental State  Exam  Not completed: Unable to complete        03/13/2024    1:50 PM 12/15/2022    9:01 AM  6CIT Screen  What Year? 0 points 0 points  What month? 0 points 0 points  What time? 0 points 0 points  Count back from 20 0 points 0 points  Months in reverse 0 points 0 points  Repeat phrase 0 points 0 points  Total Score 0  points 0 points    Immunizations Immunization History  Administered Date(s) Administered   PFIZER(Purple Top)SARS-COV-2 Vaccination 04/26/2020, 05/17/2020, 10/17/2020   PNEUMOCOCCAL CONJUGATE-20 10/04/2023   Rabies, IM 12/29/2022, 01/01/2023, 01/06/2023, 01/12/2023   Tdap 12/29/2022    Screening Tests Health Maintenance  Topic Date Due   Colonoscopy  02/25/2024   HEMOGLOBIN A1C  04/02/2024   INFLUENZA VACCINE  04/14/2024   Lung Cancer Screening  07/08/2024   Diabetic kidney evaluation - eGFR measurement  10/03/2024   Diabetic kidney evaluation - Urine ACR  10/03/2024   OPHTHALMOLOGY EXAM  10/08/2024   Medicare Annual Wellness (AWV)  03/13/2025   DTaP/Tdap/Td (2 - Td or Tdap) 12/28/2032   Pneumococcal Vaccine: 50+ Years  Completed   Hepatitis C Screening  Completed   Hepatitis B Vaccines  Aged Out   HPV VACCINES  Aged Out   Meningococcal B Vaccine  Aged Out   FOOT EXAM  Discontinued   COVID-19 Vaccine  Discontinued   Zoster Vaccines- Shingrix  Discontinued    Health Maintenance  Health Maintenance Due  Topic Date Due   Colonoscopy  02/25/2024   Health Maintenance Items Addressed: Yes Patient aware of current care gaps.  Immunization record was verified by [facility] and updated in patient's chart. Patient is due colonoscopy.  Additional Screening:  Vision Screening: Recommended annual ophthalmology exams for early detection of glaucoma and other disorders of the eye. Would you like a referral to an eye doctor? No    Dental Screening: Recommended annual dental exams for proper oral hygiene  Community Resource Referral / Chronic Care Management: CRR required this visit?  No   CCM required this visit?  No   Plan:    I have personally reviewed and noted the following in the patient's chart:   Medical and social history Use of alcohol, tobacco or illicit drugs  Current medications and supplements including opioid prescriptions. Patient is not currently taking opioid  prescriptions. Functional ability and status Nutritional status Physical activity Advanced directives List of other physicians Hospitalizations, surgeries, and ER visits in previous 12 months Vitals Screenings to include cognitive, depression, and falls Referrals and appointments  In addition, I have reviewed and discussed with patient certain preventive protocols, quality metrics, and best practice recommendations. A written personalized care plan for preventive services as well as general preventive health recommendations were provided to patient.   Roz LOISE Fuller, LPN   3/69/7974   After Visit Summary: (Declined) Due to this being a telephonic visit, with patients personalized plan was offered to patient but patient Declined AVS at this time   Notes: Patient aware of current care gaps.  Immunization record was verified by [facility] and updated in patient's chart. Patient is due for a colonoscopy.

## 2024-03-13 NOTE — Patient Instructions (Signed)
 Mr. Wilensky , Thank you for taking time out of your busy schedule to complete your Annual Wellness Visit with me. I enjoyed our conversation and look forward to speaking with you again next year. I, as well as your care team,  appreciate your ongoing commitment to your health goals. Please review the following plan we discussed and let me know if I can assist you in the future. Your Game plan/ To Do List    Referrals: If you haven't heard from the office you've been referred to, please reach out to them at the phone provided.   Follow up Visits: Next Medicare AWV with our clinical staff: 03/15/2025 at 4:10 pm phone visit with Nurse health Advisor   Have you seen your provider in the last 6 months (3 months if uncontrolled diabetes)? Yes Next Office Visit with your provider: 03/21/2024 at 3:10 pm office visit with Dr. Cleotilde  Clinician Recommendations:  Aim for 30 minutes of exercise or brisk walking, 6-8 glasses of water, and 5 servings of fruits and vegetables each day.       This is a list of the screening recommended for you and due dates:  Health Maintenance  Topic Date Due   Colon Cancer Screening  02/25/2024   Hemoglobin A1C  04/02/2024   Flu Shot  04/14/2024   Screening for Lung Cancer  07/08/2024   Yearly kidney function blood test for diabetes  10/03/2024   Yearly kidney health urinalysis for diabetes  10/03/2024   Eye exam for diabetics  10/08/2024   Medicare Annual Wellness Visit  03/13/2025   DTaP/Tdap/Td vaccine (2 - Td or Tdap) 12/28/2032   Pneumococcal Vaccine for age over 47  Completed   Hepatitis C Screening  Completed   Hepatitis B Vaccine  Aged Out   HPV Vaccine  Aged Out   Meningitis B Vaccine  Aged Out   Complete foot exam   Discontinued   COVID-19 Vaccine  Discontinued   Zoster (Shingles) Vaccine  Discontinued    Advanced directives: (Declined) Advance directive discussed with you today. Even though you declined this today, please call our office should you change  your mind, and we can give you the proper paperwork for you to fill out. Advance Care Planning is important because it:  [x]  Makes sure you receive the medical care that is consistent with your values, goals, and preferences  [x]  It provides guidance to your family and loved ones and reduces their decisional burden about whether or not they are making the right decisions based on your wishes.  Follow the link provided in your after visit summary or read over the paperwork we have mailed to you to help you started getting your Advance Directives in place. If you need assistance in completing these, please reach out to us  so that we can help you!  See attachments for Preventive Care and Fall Prevention Tips.

## 2024-03-21 ENCOUNTER — Ambulatory Visit (HOSPITAL_COMMUNITY)
Admission: RE | Admit: 2024-03-21 | Discharge: 2024-03-21 | Disposition: A | Discharge status: Home / Self Care | Source: Ambulatory Visit | Attending: Family Medicine | Admitting: Family Medicine

## 2024-03-21 ENCOUNTER — Ambulatory Visit: Admitting: Student

## 2024-03-21 ENCOUNTER — Telehealth: Payer: Self-pay | Admitting: Student

## 2024-03-21 VITALS — BP 128/82 | HR 94 | Wt 182.4 lb

## 2024-03-21 DIAGNOSIS — Z1389 Encounter for screening for other disorder: Secondary | ICD-10-CM

## 2024-03-21 DIAGNOSIS — I739 Peripheral vascular disease, unspecified: Secondary | ICD-10-CM | POA: Diagnosis present

## 2024-03-21 DIAGNOSIS — E119 Type 2 diabetes mellitus without complications: Secondary | ICD-10-CM | POA: Insufficient documentation

## 2024-03-21 LAB — VAS US ABI WITH/WO TBI
Left ABI: 1.08
Right ABI: 1.1

## 2024-03-21 NOTE — Progress Notes (Signed)
    SUBJECTIVE:   CHIEF COMPLAINT / HPI:   [name] is a 68 y.o. male presenting for abnormal screening for PAD by home health nurse. He reports his legs start to feel tired after walking but her denies pain. Denies SOB, CP with exertion. Controlled diabetic with metformin . Requesting diabetic shoes today.   PERTINENT  PMH / PSH: reviewed and updated.  OBJECTIVE:   BP 128/82   Pulse 94   Wt 182 lb 6.4 oz (82.7 kg)   SpO2 100%   BMI 23.42 kg/m   Well-appearing, no acute distress Cardio: Regular rate, regular rhythm, no murmurs on exam. Pulm: Clear, no wheezing, no crackles. No increased work of breathing Abdominal: bowel sounds present, soft, non-tender, non-distended Extremities: no peripheral edema  Neuro: alert and oriented x3, speech normal in content,  hard of hearing   ASSESSMENT/PLAN:   Assessment & Plan PAD (peripheral artery disease) (HCC) Ordered ABI today  Type 2 diabetes mellitus without complication, unspecified whether long term insulin  use (HCC) Diabetic shoe order placed today      Damien Pinal, DO [facility]

## 2024-03-21 NOTE — Patient Instructions (Signed)
 It was great to see you today!   Future Appointments  Date Time Provider Department Center  03/15/2025  4:10 PM [facility] ANNUAL WELLNESS VISIT [facility]    Please arrive 15 minutes before your appointment to ensure smooth check in process.    Please call the clinic at [phone] if your symptoms worsen or you have any concerns.  Thank you for allowing me to participate in your care, Dr. Damien Pinal [facility]

## 2024-03-21 NOTE — Assessment & Plan Note (Signed)
 Diabetic shoe order placed today

## 2024-03-21 NOTE — Telephone Encounter (Signed)
 DME order for diabetic shoe placed.

## 2024-03-22 ENCOUNTER — Ambulatory Visit: Payer: Self-pay | Admitting: Student

## 2024-03-24 NOTE — Telephone Encounter (Signed)
 Printed order and patient demographics. Placed in medical records bin for last OV note.   Please fax back to [phone] once complete.   Chiquita JAYSON English, RN

## 2024-04-18 ENCOUNTER — Other Ambulatory Visit: Payer: Self-pay | Admitting: Student

## 2024-04-18 DIAGNOSIS — N529 Male erectile dysfunction, unspecified: Secondary | ICD-10-CM

## 2024-05-18 ENCOUNTER — Ambulatory Visit (INDEPENDENT_AMBULATORY_CARE_PROVIDER_SITE_OTHER): Admitting: Student

## 2024-05-18 VITALS — BP 123/80 | HR 60 | Ht 74.0 in | Wt 186.0 lb

## 2024-05-18 DIAGNOSIS — R490 Dysphonia: Secondary | ICD-10-CM

## 2024-05-18 NOTE — Patient Instructions (Signed)
 It was great to see you! Thank you for allowing me to participate in your care!   I recommend that you always bring your medications to each appointment as this makes it easy to ensure we are on the correct medications and helps us  not miss when refills are needed.  Our plans for today:  -Continue to stay hydrated, please stop smoking, we will wait and see if your voice improves.  If symptoms are not improving over the next couple weeks, please return to care we can send you to ENT to be evaluated for your vocal cords - If you develop bloody cough, fevers, trouble breathing please seek emergent medical care.  Take care and seek immediate care sooner if you develop any concerns. Please remember to show up 15 minutes before your scheduled appointment time!  Gladis Church, DO [facility]

## 2024-05-18 NOTE — Progress Notes (Signed)
    SUBJECTIVE:   CHIEF COMPLAINT / HPI:   Hoarse voice Patient is concerned that his voice has been a little more hoarse starting 2 days ago after inhalation of drywall/paint particles.  He denies fevers, chills, cough, congestion, rhinorrhea, hemoptysis, shortness of breath, dysphagia.  Smoking at least 2 packs a day.  Discussed with patient that his hoarse voice may or may not be related to his inhalation.  Given the acuity, I think watchful waiting is reasonable-and he has this preference rather than seeing ENT.  Given smoking history, if his symptoms are not resolving we absolutely should image him for laryngeal/neck cancers.  Patient expressed understanding and agreement.  Low-dose CT scan for lung cancer screening was completed November 2024, lung RADS 1 and evidence of centrilobular/paraseptal emphysema.  Patient may benefit from discussion of COPD, however he is not having symptoms of cough or shortness of breath at this time.  OBJECTIVE:   BP 123/80   Pulse 60   Ht 6' 2 (1.88 m)   Wt 186 lb (84.4 kg)   SpO2 100%   BMI 23.88 kg/m    General: NAD, pleasant HEENT: Normocephalic, atraumatic head.  Hearing aids present, removed for evaluation: Normal external ear, canal, TM bilaterally. EOM intact and normal conjunctiva BL. Normal external nose. Throat not erythematous, no exudate, no deviation. Normal dentition.  Cardio: RRR, no MRG. Respiratory: CTAB, normal wob on RA Skin: Warm and dry  ASSESSMENT/PLAN:   Assessment & Plan Hoarseness of voice Differential: Laryngitis (acute irritant induced), vocal cord polyps, allergic laryngitis.  Low concern for severe infection given afebrile, normal vitals.  Is at risk of laryngeal cancers given smoking history-but given acuity of symptoms after exposure will institute watchful waiting. - Follow-up if symptoms fail to improve over the next 2 weeks, consider ENT referral for laryngoscopy versus CT imaging of neck - ED precautions  provided   Gladis Church, DO [facility]

## 2024-06-26 ENCOUNTER — Other Ambulatory Visit: Payer: Self-pay | Admitting: Student

## 2024-06-26 DIAGNOSIS — N529 Male erectile dysfunction, unspecified: Secondary | ICD-10-CM

## 2024-07-25 ENCOUNTER — Ambulatory Visit: Admitting: Student

## 2024-07-31 ENCOUNTER — Ambulatory Visit: Admitting: Student

## 2024-07-31 ENCOUNTER — Encounter: Payer: Self-pay | Admitting: Student

## 2024-07-31 VITALS — BP 130/81 | HR 52 | Ht 75.0 in | Wt 187.0 lb

## 2024-07-31 DIAGNOSIS — I10 Essential (primary) hypertension: Secondary | ICD-10-CM | POA: Diagnosis not present

## 2024-07-31 DIAGNOSIS — Z87891 Personal history of nicotine dependence: Secondary | ICD-10-CM

## 2024-07-31 DIAGNOSIS — E119 Type 2 diabetes mellitus without complications: Secondary | ICD-10-CM

## 2024-07-31 DIAGNOSIS — N529 Male erectile dysfunction, unspecified: Secondary | ICD-10-CM | POA: Diagnosis not present

## 2024-07-31 MED ORDER — SILDENAFIL CITRATE 50 MG PO TABS: 50.0000 mg | ORAL_TABLET | Freq: Every day | ORAL | 0 refills | Status: DC | PRN

## 2024-07-31 MED ORDER — LOSARTAN POTASSIUM 100 MG PO TABS: 100.0000 mg | ORAL_TABLET | Freq: Every day | ORAL | 3 refills | Status: AC

## 2024-07-31 MED ORDER — ATORVASTATIN CALCIUM 20 MG PO TABS
20.0000 mg | ORAL_TABLET | Freq: Every day | ORAL | 2 refills | Status: AC
Start: 2024-07-31 — End: 2025-04-27

## 2024-07-31 NOTE — Patient Instructions (Signed)
 It was great to see you today!   Continue all your medications.   No lab work today. We will repeat blood work in 3 months   Future Appointments  Date Time Provider Department Center  03/15/2025  4:10 PM [facility] ANNUAL WELLNESS VISIT [facility]    Please arrive 15 minutes before your appointment to ensure smooth check in process.  If you are more than 15 minutes late, you may be asked to reschedule.   Please bring a list of your medications with you to all appointments.   Please call the clinic at [phone] if your symptoms worsen or you have any concerns.  Thank you for allowing me to participate in your care, Dr. Damien Pinal [facility]

## 2024-07-31 NOTE — Assessment & Plan Note (Signed)
 BP within goal today  Continue Losartan  100 mg daily

## 2024-07-31 NOTE — Progress Notes (Signed)
    SUBJECTIVE:   CHIEF COMPLAINT / HPI:   [name] is a 68 y.o. male presenting for diabetes follow up.   Glycemic control - Diabetes managed with metformin  - Last hemoglobin A1c checked in January 2025; due for repeat blood work  Hyperlipidemia management - Consistently takes medication for hyperlipidemia since initiation a couple of months ago  Tobacco use - Smokes approximately ten cigarettes per day - Current smoking is less than previous levels - Has been attempting to quit for two years - Previously tried quitting with pharmacist support and medication, but was unsuccessful - Expresses desire to quit smoking  PERTINENT  PMH / PSH: reviewed and updated.  OBJECTIVE:   BP 130/81   Pulse (!) 52   Ht 6' 3 (1.905 m)   Wt 187 lb (84.8 kg)   SpO2 100%   BMI 23.37 kg/m   Well-appearing, no acute distress Cardio: Regular rate, regular rhythm, no murmurs on exam. Pulm: Clear, no wheezing, no crackles. No increased work of breathing Abdominal: bowel sounds present, soft, non-tender, non-distended Extremities: no peripheral edema   ASSESSMENT/PLAN:   Assessment & Plan Type 2 diabetes mellitus without complication, without long-term current use of insulin  (HCC) Continue metformin  500 mg BID Consider switching to Metformin  ER 500 mg daily if repeat A1c still within a good range Due for A1c, Lipid, urine protein at next visit  Hypertension, unspecified type BP within goal today  Continue Losartan  100 mg daily  Erectile dysfunction, unspecified erectile dysfunction type Refilled Viagra   History of tobacco use Discussed smoking cessation Encouraged follow up with Dr. Koval patient is hesitant because he told Dr. Koval that he had quit a few months ago when he did not.  Ordered lung cancer screening today      Damien Pinal, DO [facility]

## 2024-07-31 NOTE — Assessment & Plan Note (Signed)
 Discussed smoking cessation Encouraged follow up with Dr. Koval patient is hesitant because he told Dr. Koval that he had quit a few months ago when he did not.  Ordered lung cancer screening today

## 2024-07-31 NOTE — Assessment & Plan Note (Signed)
 Refilled Viagra

## 2024-08-09 ENCOUNTER — Ambulatory Visit
Admission: RE | Admit: 2024-08-09 | Discharge: 2024-08-09 | Disposition: A | Discharge status: Home / Self Care | Source: Ambulatory Visit | Attending: Family Medicine | Admitting: Family Medicine

## 2024-08-09 DIAGNOSIS — Z87891 Personal history of nicotine dependence: Secondary | ICD-10-CM

## 2024-08-18 ENCOUNTER — Ambulatory Visit: Payer: Self-pay | Admitting: Student

## 2024-09-12 ENCOUNTER — Other Ambulatory Visit: Payer: Self-pay

## 2024-09-12 DIAGNOSIS — N529 Male erectile dysfunction, unspecified: Secondary | ICD-10-CM

## 2024-09-12 MED ORDER — SILDENAFIL CITRATE 50 MG PO TABS: 50.0000 mg | ORAL_TABLET | Freq: Every day | ORAL | 0 refills | Status: AC | PRN

## 2024-10-12 ENCOUNTER — Other Ambulatory Visit: Payer: Self-pay | Admitting: Student

## 2024-10-12 DIAGNOSIS — E119 Type 2 diabetes mellitus without complications: Secondary | ICD-10-CM

## 2025-03-15 ENCOUNTER — Encounter
